# Patient Record
Sex: Male | Born: 1935 | Race: Asian | Hispanic: No | Marital: Single | Smoking: Former smoker
Health system: Southern US, Community
[De-identification: ages and names within clinical notes are randomized; demographics above are authoritative.]

## PROBLEM LIST (undated history)

## (undated) DIAGNOSIS — H5702 Anisocoria: Secondary | ICD-10-CM

## (undated) DIAGNOSIS — M1990 Unspecified osteoarthritis, unspecified site: Secondary | ICD-10-CM

## (undated) DIAGNOSIS — Z993 Dependence on wheelchair: Secondary | ICD-10-CM

## (undated) DIAGNOSIS — H269 Unspecified cataract: Secondary | ICD-10-CM

## (undated) DIAGNOSIS — J449 Chronic obstructive pulmonary disease, unspecified: Secondary | ICD-10-CM

## (undated) HISTORY — DX: Dependence on wheelchair: Z99.3

## (undated) HISTORY — DX: Anisocoria: H57.02

## (undated) HISTORY — DX: Chronic obstructive pulmonary disease, unspecified: J44.9

---

## 2014-08-07 ENCOUNTER — Encounter (HOSPITAL_COMMUNITY): Payer: Self-pay

## 2014-08-07 ENCOUNTER — Emergency Department (INDEPENDENT_AMBULATORY_CARE_PROVIDER_SITE_OTHER)
Admission: EM | Admit: 2014-08-07 | Discharge: 2014-08-07 | Disposition: A | Discharge status: Home / Self Care | Payer: Medicaid Other | Source: Home / Self Care | Attending: Family Medicine | Admitting: Family Medicine

## 2014-08-07 DIAGNOSIS — R9431 Abnormal electrocardiogram [ECG] [EKG]: Secondary | ICD-10-CM

## 2014-08-07 DIAGNOSIS — Z993 Dependence on wheelchair: Secondary | ICD-10-CM | POA: Diagnosis not present

## 2014-08-07 HISTORY — DX: Unspecified osteoarthritis, unspecified site: M19.90

## 2014-08-07 HISTORY — DX: Unspecified cataract: H26.9

## 2014-08-07 LAB — POCT I-STAT, CHEM 8
BUN: 9 mg/dL (ref 6–20)
CREATININE: 0.9 mg/dL (ref 0.61–1.24)
Calcium, Ion: 1.21 mmol/L (ref 1.13–1.30)
Chloride: 101 mmol/L (ref 101–111)
Glucose, Bld: 99 mg/dL (ref 65–99)
HCT: 41 % (ref 39.0–52.0)
Hemoglobin: 13.9 g/dL (ref 13.0–17.0)
POTASSIUM: 4.2 mmol/L (ref 3.5–5.1)
Sodium: 140 mmol/L (ref 135–145)
TCO2: 26 mmol/L (ref 0–100)

## 2014-08-07 NOTE — ED Provider Notes (Signed)
Lee Irwin is a 81 y.o. male who presents to Urgent Care today for establish care. Patient is a very elderly gentleman from [place]. He recently immigrated a few days ago. He presents to urgent care today and attempt to establish care. He has several medical conditions most prominently mobility impairment due to a CVA about 7 years ago. His son state that he has trouble making his feet work and walking. He has been confined to wheelchair for the last several years. He has not had any worsening symptoms recently. Additionally he has some unknown irregular heartbeat that was diagnosed in [place] for which he only takes aspirin. He denies any chest pains currently. Additionally he notes poor vision however this is been ongoing for some time now with no acute changes. His son say that he eats and drinks well and seems to be in good health. Of note there concerned about tuberculosis. As part of his immigration to have states he had a workup including a chest x-ray that was concerning for tuberculosis. He's had 3 negative sputum tests dated June 7, eighth, ninth of 2016. This information will be scanned into the computer system.   Past Medical History  Diagnosis Date  . Stroke   . Arthritis   . Cataract    History reviewed. No pertinent past surgical history. History  Substance Use Topics  . Smoking status: Unknown If Ever Smoked  . Smokeless tobacco: Not on file  . Alcohol Use: No   ROS as above Medications: No current facility-administered medications for this encounter.   Current Outpatient Prescriptions  Medication Sig Dispense Refill  . Aspirin (ECOTRIN PO) Take 75 mg by mouth.     No Known Allergies   Exam:  BP 120/51 mmHg  Pulse 80  Temp(Src) 98.2 F (36.8 C) (Oral)  Resp 20  SpO2 97% Gen: Thin ill-appearing elderly man HEENT: EOMI,  MMM Lungs: Normal work of breathing. CTABL Heart: RRR no MRG Abd: NABS, Soft. Nondistended, Nontender Exts: Brisk capillary refill, warm  and well perfused. Flexion contractures of the ankles and knees present.  ED ECG REPORT   Date: 08/07/2014  Rate: 77  Rhythm: normal sinus rhythm  QRS Axis: normal  Intervals: QT prolonged and QTc 502  ST/T Wave abnormalities: T wave inversion V2 and V3  Conduction Disutrbances:nonspecific intraventricular conduction delay  Narrative Interpretation:   Old EKG Reviewed: none available   No arrhythmia on 1 minute rhythm strip.  I have personally reviewed the EKG tracing and agree with the computerized printout as noted.   Results for orders placed or performed during the hospital encounter of 08/07/14 (from the past 24 hour(s))  I-STAT, chem 8     Status: None   Collection Time: 08/07/14  3:23 PM  Result Value Ref Range   Sodium 140 135 - 145 mmol/L   Potassium 4.2 3.5 - 5.1 mmol/L   Chloride 101 101 - 111 mmol/L   BUN 9 6 - 20 mg/dL   Creatinine, Ser 1.610.90 0.61 - 1.24 mg/dL   Glucose, Bld 99 65 - 99 mg/dL   Calcium, Ion 0.961.21 0.451.13 - 1.30 mmol/L   TCO2 26 0 - 100 mmol/L   Hemoglobin 13.9 13.0 - 17.0 g/dL   HCT 40.941.0 81.139.0 - 91.452.0 %   No results found.  Assessment and Plan: 81 y.o. male with multiple chronic medical conditions mostly related to his age and his history of CVA. His most important medical problem now is his wheelchair bound status.  I don't think are going to be able to fix any of his chronic problems at today's visit. Ultimately he requires a primary care provider and I have given information to refer to the [facility] wellness [facility]. He should continue taking aspirin and follow-up with a PCP. No hypertension or arrhythmia on today's rhythm strip. I think patient does not have tuberculosis based on his sputum studies. He may need a follow-up with an ophthalmologist at some point in the future for cataract surgery  [place] phone interpreter used for today's visit.  Discussed warning signs or symptoms. Please see discharge instructions. Patient expresses  understanding.     Rodolph Bong, MD 08/07/14 [phone]

## 2014-08-07 NOTE — Discharge Instructions (Signed)
Thank you for coming in today. Follow up with [facility] [address] [place], [place] [address] [phone]  Please call or see Ms Antionette CharMaggy Mena for assistance with your bill.  You may qualify for reduced or free services.  Her phone number is [phone]. Her email is [email or web address]  Call or go to the emergency room if you get worse, have trouble breathing, have chest pains, or palpitations.

## 2014-08-07 NOTE — ED Notes (Addendum)
Here as recent immigrant/ refugee from [place] via [place]. Case worker was unable to get a PCP for him to do a health assessment today. Concern for poss. TB, arrhythmia of heart.  Has papers from [facility] lab reports of 3 sequential days of negative sputum smears done earlier this month. Not on any current TB therapy

## 2014-08-08 NOTE — ED Notes (Signed)
Accessed record for case manager, looking for papers left in department.  Located envelope with patient's name and agreed to leave at the front office for grandson to pick up.  Adam at front desk notified of plan

## 2014-08-10 ENCOUNTER — Encounter: Payer: Self-pay | Admitting: Family Medicine

## 2014-08-10 ENCOUNTER — Other Ambulatory Visit (HOSPITAL_COMMUNITY)
Admission: RE | Admit: 2014-08-10 | Discharge: 2014-08-10 | Disposition: A | Discharge status: Home / Self Care | Payer: Medicaid Other | Source: Ambulatory Visit | Attending: Family Medicine | Admitting: Family Medicine

## 2014-08-10 ENCOUNTER — Ambulatory Visit (INDEPENDENT_AMBULATORY_CARE_PROVIDER_SITE_OTHER): Payer: Medicaid Other | Admitting: Family Medicine

## 2014-08-10 ENCOUNTER — Ambulatory Visit: Payer: Self-pay | Admitting: Family Medicine

## 2014-08-10 VITALS — BP 130/55 | HR 81 | Temp 97.7°F

## 2014-08-10 DIAGNOSIS — A150 Tuberculosis of lung: Secondary | ICD-10-CM | POA: Diagnosis not present

## 2014-08-10 DIAGNOSIS — M24561 Contracture, right knee: Secondary | ICD-10-CM | POA: Diagnosis not present

## 2014-08-10 DIAGNOSIS — Z008 Encounter for other general examination: Secondary | ICD-10-CM

## 2014-08-10 DIAGNOSIS — M6250 Muscle wasting and atrophy, not elsewhere classified, unspecified site: Secondary | ICD-10-CM

## 2014-08-10 DIAGNOSIS — Z603 Acculturation difficulty: Secondary | ICD-10-CM

## 2014-08-10 DIAGNOSIS — Z993 Dependence on wheelchair: Secondary | ICD-10-CM

## 2014-08-10 DIAGNOSIS — M24562 Contracture, left knee: Secondary | ICD-10-CM

## 2014-08-10 DIAGNOSIS — J449 Chronic obstructive pulmonary disease, unspecified: Secondary | ICD-10-CM | POA: Insufficient documentation

## 2014-08-10 DIAGNOSIS — Z113 Encounter for screening for infections with a predominantly sexual mode of transmission: Secondary | ICD-10-CM | POA: Insufficient documentation

## 2014-08-10 DIAGNOSIS — H5702 Anisocoria: Secondary | ICD-10-CM | POA: Diagnosis not present

## 2014-08-10 DIAGNOSIS — D509 Iron deficiency anemia, unspecified: Secondary | ICD-10-CM | POA: Diagnosis not present

## 2014-08-10 DIAGNOSIS — A159 Respiratory tuberculosis unspecified: Secondary | ICD-10-CM | POA: Insufficient documentation

## 2014-08-10 DIAGNOSIS — Z0289 Encounter for other administrative examinations: Secondary | ICD-10-CM

## 2014-08-10 DIAGNOSIS — J439 Emphysema, unspecified: Secondary | ICD-10-CM

## 2014-08-10 LAB — COMPREHENSIVE METABOLIC PANEL
ALBUMIN: 3.2 g/dL — AB (ref 3.5–5.2)
ALT: 8 U/L (ref 0–53)
AST: 14 U/L (ref 0–37)
Alkaline Phosphatase: 75 U/L (ref 39–117)
BILIRUBIN TOTAL: 0.5 mg/dL (ref 0.2–1.2)
BUN: 11 mg/dL (ref 6–23)
CALCIUM: 8.5 mg/dL (ref 8.4–10.5)
CO2: 26 meq/L (ref 19–32)
CREATININE: 0.91 mg/dL (ref 0.50–1.35)
Chloride: 101 mEq/L (ref 96–112)
Glucose, Bld: 105 mg/dL — ABNORMAL HIGH (ref 70–99)
POTASSIUM: 3.7 meq/L (ref 3.5–5.3)
SODIUM: 137 meq/L (ref 135–145)
Total Protein: 8.3 g/dL (ref 6.0–8.3)

## 2014-08-10 LAB — LIPID PANEL
CHOL/HDL RATIO: 3.4 ratio
Cholesterol: 115 mg/dL (ref 0–200)
HDL: 34 mg/dL — AB (ref >=40)
LDL CALC: 64 mg/dL (ref 0–99)
Triglycerides: 86 mg/dL (ref <150)
VLDL: 17 mg/dL (ref 0–40)

## 2014-08-10 LAB — CBC WITH DIFFERENTIAL/PLATELET
BASOS ABS: 0 10*3/uL (ref 0.0–0.1)
Basophils Relative: 0 % (ref 0–1)
Eosinophils Absolute: 0.1 10*3/uL (ref 0.0–0.7)
Eosinophils Relative: 2 % (ref 0–5)
HEMATOCRIT: 39.7 % (ref 39.0–52.0)
Hemoglobin: 12.9 g/dL — ABNORMAL LOW (ref 13.0–17.0)
LYMPHS PCT: 35 % (ref 12–46)
Lymphs Abs: 2.2 10*3/uL (ref 0.7–4.0)
MCH: 29.3 pg (ref 26.0–34.0)
MCHC: 32.5 g/dL (ref 30.0–36.0)
MCV: 90.2 fL (ref 78.0–100.0)
MONO ABS: 0.3 10*3/uL (ref 0.1–1.0)
MPV: 8.9 fL (ref 8.6–12.4)
Monocytes Relative: 4 % (ref 3–12)
Neutro Abs: 3.7 10*3/uL (ref 1.7–7.7)
Neutrophils Relative %: 59 % (ref 43–77)
Platelets: 224 10*3/uL (ref 150–400)
RBC: 4.4 MIL/uL (ref 4.22–5.81)
RDW: 15.2 % (ref 11.5–15.5)
WBC: 6.3 10*3/uL (ref 4.0–10.5)

## 2014-08-10 LAB — RPR

## 2014-08-10 LAB — TSH: TSH: 2.216 u[IU]/mL (ref 0.350–4.500)

## 2014-08-10 MED ORDER — TIOTROPIUM BROMIDE MONOHYDRATE 18 MCG IN CAPS: 18.0000 ug | ORAL_CAPSULE | Freq: Every day | RESPIRATORY_TRACT | Status: DC

## 2014-08-10 MED ORDER — OMEPRAZOLE 20 MG PO CPDR: 20.0000 mg | DELAYED_RELEASE_CAPSULE | Freq: Every day | ORAL | Status: DC

## 2014-08-10 MED ORDER — FLUTICASONE-SALMETEROL 100-50 MCG/DOSE IN AEPB: 1.0000 | INHALATION_SPRAY | Freq: Two times a day (BID) | RESPIRATORY_TRACT | Status: DC

## 2014-08-10 NOTE — Progress Notes (Signed)
Subjective:   Language Resources:  Lee Irwin interpreter  Lee Irwin is a 81 y.o. male who presents to [facility] today to establish care:  1.  Wheelchair bound:  States secondary to "Paralysis" in Right leg.  Has had gradually weakening since then.  Was weaker on Right side first and then bilaterally, but with good strength now.  Does have chronic muscle wasting and knee contractures.    2.  Underweight:  Eats three times a day but small amount.  Diarrhea if tries to eat more than a handful of food.  Eats rice slurry to make it soft (porridge), sometimes milk, lentil soup.  Also has tea, cookies, and bread.    Several chronic medical issues: COPD, A fib, EF < 40%, BL knee contractures secondary to stroke, anemia (Hgb of 9.6 based on labs obtained 06/15/2010), decreased vision in both eyes. Hx/o treated TB.  He was previously taking Clopidogrol 75 mg daily (unclear indication), albuterol 2 puffs as needed, and the overseas equivalent of Advair (Seroflow).    Current medications:   Spiriva [place] equivalent of Advair Plavix (though he doesn't have this with him) Omeprazole 20 Theophylline 100 mcg ASA 75 mg  No other bronchodilators.    Arrived in [place] on July 31, 2014.   Wheelchair bound for 4-5 years.  From [place].  Lived in refugee camp in [place] for 22 years.  Originally from [place].  Patient and family state that his memory is very good.  Son and Son in law present along with Far Ri from [facility].   Lives with son, daughter in law, granddaughter, and grandson-in-law.    ROS as above per HPI, otherwise neg.  Pertinently, no chest pain, palpitations, SOB, Fever, Chills, Abd pain, N/V/D.   The following portions of the patient's history were reviewed and updated as appropriate: allergies, current medications, past medical history, family and social history, and problem list.    PMH reviewed.  Past Medical History  Diagnosis Date  . Stroke   . Arthritis   . Cataract     No past surgical history on file.  Medications reviewed. Current Outpatient Prescriptions  Medication Sig Dispense Refill  . Aspirin (ECOTRIN PO) Take 75 mg by mouth.     No current facility-administered medications for this visit.     Objective:   Physical Exam BP 130/55 mmHg  Pulse 81  Temp(Src) 97.7 F (36.5 C) (Oral) Gen:  Alert, cooperative patient who appears stated age in no acute distress.  Vital signs reviewed.  Very thin male.  Seated in wheelchair.  Cachexia noted.   HEENT: EOMI.  Right eye with some mild reaction to light.  Notable cataract.  Left eye does NOT react.  However states he can see well.  MMM.  Marked temporal wasting.   Neck:  Supple.   Cardiac:  Regular rate and rhythm  Pulm:  Clear to auscultation bilaterally with good air movement.  No wheezes or rales noted.   Abd: Thin.  No masses.  Nontender.   Exts: Non edematous BL  LE, warm and well perfused.  MSK:  Unable to extend legs beyond about 60 degrees.  Able to fully move feet and ankles.  Also BL UE's moves well.   Skin:  No rash or skin breakdown noted.   Neuro:  Alert and oriented x 3.  Strength 5/5 BL UE's and BL LE's.  Sensation intact throughout.  Psych:  Not depressed or anxious appearing.  Linear and coherent thought process as  evidenced by speech pattern. Smiles spontaneously.   Spent an hour in patient care, including 45 minutes of face time with patient.

## 2014-08-10 NOTE — Assessment & Plan Note (Signed)
Not entirely clear why.  Seems mostly to be deconditioning and lack of muscle mass. Family states they were told "paralysis" but can fully move all arms and legs with good strength.   Awaiting for labwork to return.

## 2014-08-10 NOTE — Patient Instructions (Signed)
Come back for a short visit on Friday July 22.  We will review your medicines.    Come back around 9 AM.  It is OK to double-book.

## 2014-08-10 NOTE — Assessment & Plan Note (Signed)
Based on IOM paperwork.   Without over blood loss.  Also cachectic.   Repeating blood work today.  Concern of course would be for cancer.   May need stool cards versus colonoscopy.

## 2014-08-10 NOTE — Assessment & Plan Note (Signed)
Unclear etiology. Doesn't appear to have had a stroke, unless he has had very good recovery.  Also knee contractures are bilateral and he is without weakness.

## 2014-08-10 NOTE — Assessment & Plan Note (Signed)
[place]reated overseas.  Negative sputums in 2012 based on IOM paperwork. Has decreased BS Left side.   Per case worker who is present today hasn't yet had HD screening appt..  May need CXR before then depending on when it's scheduled for.

## 2014-08-10 NOTE — Assessment & Plan Note (Signed)
Previously on both Advair and spiriva.  Has both of these today.  Also treated with theophylline.   As the visit took so long today, did not have time to discuss changes to his medication regimen.  Plan is to return in next 2-3 weeks for simple discussion on medication usage and refill.  Will stop theophylline at that visit.   Plan refill other inhalers.  Sounds like he doesn't and hasn't needed bronchodilator in some time.

## 2014-08-11 LAB — HIV ANTIBODY (ROUTINE TESTING W REFLEX): HIV 1&2 Ab, 4th Generation: NONREACTIVE

## 2014-08-11 LAB — SICKLE CELL SCREEN: SICKLE CELL SCREEN: NEGATIVE

## 2014-08-11 LAB — HEPATITIS B SURFACE ANTIGEN: Hepatitis B Surface Ag: NEGATIVE

## 2014-08-12 LAB — QUANTIFERON TB GOLD ASSAY (BLOOD)
Interferon Gamma Release Assay: NEGATIVE
MITOGEN VALUE: 6.25 [IU]/mL
Quantiferon Nil Value: 0.09 IU/mL
Quantiferon Tb Ag Minus Nil Value: 0.12 IU/mL
TB Ag value: 0.21 IU/mL

## 2014-08-14 LAB — URINE CYTOLOGY ANCILLARY ONLY
CHLAMYDIA, DNA PROBE: NEGATIVE
NEISSERIA GONORRHEA: NEGATIVE

## 2014-08-14 LAB — VARICELLA ZOSTER ANTIBODY, IGG: Varicella IgG: 848.6 Index — ABNORMAL HIGH (ref <135.00)

## 2014-08-31 ENCOUNTER — Telehealth: Payer: Self-pay | Admitting: Family Medicine

## 2014-08-31 ENCOUNTER — Ambulatory Visit (INDEPENDENT_AMBULATORY_CARE_PROVIDER_SITE_OTHER): Payer: Medicaid Other | Admitting: Family Medicine

## 2014-08-31 ENCOUNTER — Encounter: Payer: Self-pay | Admitting: Family Medicine

## 2014-08-31 ENCOUNTER — Ambulatory Visit (HOSPITAL_COMMUNITY)
Admission: RE | Admit: 2014-08-31 | Discharge: 2014-08-31 | Disposition: A | Discharge status: Home / Self Care | Payer: Medicaid Other | Source: Ambulatory Visit | Attending: Family Medicine | Admitting: Family Medicine

## 2014-08-31 VITALS — BP 130/66 | HR 80 | Temp 97.5°F

## 2014-08-31 DIAGNOSIS — R05 Cough: Secondary | ICD-10-CM | POA: Diagnosis present

## 2014-08-31 DIAGNOSIS — J439 Emphysema, unspecified: Secondary | ICD-10-CM | POA: Diagnosis not present

## 2014-08-31 DIAGNOSIS — R634 Abnormal weight loss: Secondary | ICD-10-CM

## 2014-08-31 DIAGNOSIS — K219 Gastro-esophageal reflux disease without esophagitis: Secondary | ICD-10-CM | POA: Insufficient documentation

## 2014-08-31 DIAGNOSIS — A159 Respiratory tuberculosis unspecified: Secondary | ICD-10-CM

## 2014-08-31 DIAGNOSIS — R059 Cough, unspecified: Secondary | ICD-10-CM

## 2014-08-31 DIAGNOSIS — H269 Unspecified cataract: Secondary | ICD-10-CM | POA: Insufficient documentation

## 2014-08-31 DIAGNOSIS — Z603 Acculturation difficulty: Secondary | ICD-10-CM | POA: Diagnosis not present

## 2014-08-31 DIAGNOSIS — R918 Other nonspecific abnormal finding of lung field: Secondary | ICD-10-CM | POA: Diagnosis not present

## 2014-08-31 DIAGNOSIS — I7090 Unspecified atherosclerosis: Secondary | ICD-10-CM | POA: Diagnosis not present

## 2014-08-31 DIAGNOSIS — A150 Tuberculosis of lung: Secondary | ICD-10-CM

## 2014-08-31 MED ORDER — LEVOFLOXACIN 500 MG PO TABS: 500.0000 mg | ORAL_TABLET | Freq: Every day | ORAL | Status: DC

## 2014-08-31 MED ORDER — OMEPRAZOLE 20 MG PO CPDR: 20.0000 mg | DELAYED_RELEASE_CAPSULE | Freq: Every day | ORAL | Status: DC

## 2014-08-31 NOTE — Assessment & Plan Note (Signed)
He brings all of his medications.   Still has about 1-2 months worth of Spiriva and Advair. Continue this and will send in new scripts when he is out.  Do not want to confuse family with double dosages right now.   Discussed stopping theophylline.  FU with me if issues.  Evidently he hasn't taken this for at least 2 days, if not longer.  Has had no W/D symptoms or concerns since stopping.

## 2014-08-31 NOTE — Telephone Encounter (Signed)
Called and discussed CXR results with granddaughter, who was present at visit today and who also speaks [place].  Concern for CAP.  I have reviewed the images.  Will treat with Levaquin as he has bad emphysema.    My main concern -- which I did not share with the granddaughter -- is that this is not infectious but rather a malignancy.  Will treat as infectious with abx and then get a FU CXR.  Will have discussion with family about potentiality of carcinoma next visit.

## 2014-08-31 NOTE — Assessment & Plan Note (Signed)
With fever.   Quant gold is negative.   Sending for CXR today.  Of note, he has known COPD.  Could be unrelated with fevers, but CXR to check.

## 2014-08-31 NOTE — Assessment & Plan Note (Signed)
Ongoing for "years." Inconsistent history with dysphagia. I would say he has red flag symptoms, with marked muscle wasting and abdominal pain.  Treat with PPI for now.   Will schedule for abdominal US.

## 2014-08-31 NOTE — Assessment & Plan Note (Signed)
Quant gold negative last visit.

## 2014-08-31 NOTE — Progress Notes (Addendum)
Subjective:    Lee Irwin is a 81 y.o. male who presents to [facility] today for FU for medicines:  1.  Cough and fever: Present currently.  Had some fever without cough before coming to [place], months in total.  Daughter describes with rigors as well.  Hasn't used chronic inhalers for 2 days since fever started.  Took Ibuprofen with relief.  No chest pain.  Otherwise no complaints.    2. Heartburn:  Did not pick up Omeprazole last visit, no clear reason why.  Still with burning in abdomen after eating certain meals.  No dysphagia.  No N/V.  No melena.  Nothing relieves currently.   ROS as above per HPI, otherwise neg.   The following portions of the patient's history were reviewed and updated as appropriate: allergies, current medications, past medical history, family and social history, and problem list. Patient is a nonsmoker.    PMH reviewed.  Past Medical History  Diagnosis Date  . Wheelchair bound   . Arthritis   . Cataract   . Anisocoria   . COPD (chronic obstructive pulmonary disease)    No past surgical history on file.  Medications reviewed. Current Outpatient Prescriptions  Medication Sig Dispense Refill  . Aspirin (ECOTRIN PO) Take 75 mg by mouth.    . Fluticasone-Salmeterol (ADVAIR) 100-50 MCG/DOSE AEPB Inhale 1 puff into the lungs 2 (two) times daily. 1 each 3  . omeprazole (PRILOSEC) 20 MG capsule Take 1 capsule (20 mg total) by mouth daily. 30 capsule 3  . tiotropium (SPIRIVA HANDIHALER) 18 MCG inhalation capsule Place 1 capsule (18 mcg total) into inhaler and inhale daily. 30 capsule 12   No current facility-administered medications for this visit.     Objective:   Physical Exam BP 130/66 mmHg  Pulse 80  Temp(Src) 97.5 F (36.4 C) (Oral) Gen:  Alert, cooperative patient who appears stated age in no acute distress.  Vital signs reviewed.  Cachectic.   HEENT: EOMI.  Cataract in Right eye.  Left eye with fixed, nonreactive pupil and post-surgical changes.  MMM.   Few broken teeth lower mandible.  No teeht upper mandible.   Cardiac:  Regular rate and rhythm  Pulm:  Clear to auscultation bilaterally  Abd:  Soft/nondistended/nontender.  Thin. Exts: Thin, no edema.     No results found for this or any previous visit (from the past 72 hour(s)).  Spent 30 minutes face time with patient. Other people present were teh patient's granddaughter and Far Ri the case worker from [employer].

## 2014-08-31 NOTE — Assessment & Plan Note (Signed)
Patient desires potential removal to help with vision. Will refer to ophthalmology for more full evaluation and recommendations.   As noted, Left eye with post-surgical changes and anisocoria.

## 2014-08-31 NOTE — Patient Instructions (Signed)
Go by the hospital for a chest xray.  You can go when you want.    Ask for the radiology department at the front desk.    I will call you with the results [phone].  Grand-daughter.   Take the Omeprazole one pill a day to help with the abdominal pain.   STOP the theophylline.    Come back to see me in 1 month to make sure you're doing okay.

## 2014-08-31 NOTE — Assessment & Plan Note (Signed)
Lee Irwin with [employer] provided interpretation today.

## 2014-09-03 ENCOUNTER — Telehealth: Payer: Self-pay | Admitting: Family Medicine

## 2014-09-03 NOTE — Telephone Encounter (Signed)
Called granddaughter of patient and LMOVM. Please advise her that patient has an ultrasound of his abdomen scheduled for Fri 09/14/14 at 11:30am at [facility] ( arrival 11:15am) Patient cannot have anything to eat or drink 6 hours prior to this appt.

## 2014-09-04 NOTE — Telephone Encounter (Signed)
Patient's son has been informed of appts,

## 2014-09-14 ENCOUNTER — Ambulatory Visit (HOSPITAL_COMMUNITY)
Admission: RE | Admit: 2014-09-14 | Discharge: 2014-09-14 | Disposition: A | Discharge status: Home / Self Care | Payer: Medicaid Other | Source: Ambulatory Visit | Attending: Family Medicine | Admitting: Family Medicine

## 2014-09-14 DIAGNOSIS — K219 Gastro-esophageal reflux disease without esophagitis: Secondary | ICD-10-CM

## 2014-09-14 DIAGNOSIS — N261 Atrophy of kidney (terminal): Secondary | ICD-10-CM | POA: Insufficient documentation

## 2014-09-14 DIAGNOSIS — R634 Abnormal weight loss: Secondary | ICD-10-CM

## 2014-09-20 ENCOUNTER — Encounter: Payer: Self-pay | Admitting: Family Medicine

## 2014-09-26 ENCOUNTER — Other Ambulatory Visit: Payer: Self-pay | Admitting: Infectious Disease

## 2014-09-26 ENCOUNTER — Ambulatory Visit
Admission: RE | Admit: 2014-09-26 | Discharge: 2014-09-26 | Disposition: A | Discharge status: Home / Self Care | Payer: No Typology Code available for payment source | Source: Ambulatory Visit | Attending: Infectious Disease | Admitting: Infectious Disease

## 2014-09-26 DIAGNOSIS — A150 Tuberculosis of lung: Secondary | ICD-10-CM

## 2014-09-28 ENCOUNTER — Ambulatory Visit (INDEPENDENT_AMBULATORY_CARE_PROVIDER_SITE_OTHER): Payer: Medicaid Other | Admitting: Family Medicine

## 2014-09-28 ENCOUNTER — Encounter: Payer: Self-pay | Admitting: Family Medicine

## 2014-09-28 VITALS — BP 139/61 | HR 85 | Temp 97.9°F

## 2014-09-28 DIAGNOSIS — J439 Emphysema, unspecified: Secondary | ICD-10-CM

## 2014-09-28 DIAGNOSIS — R399 Unspecified symptoms and signs involving the genitourinary system: Secondary | ICD-10-CM

## 2014-09-28 DIAGNOSIS — R202 Paresthesia of skin: Secondary | ICD-10-CM | POA: Diagnosis not present

## 2014-09-28 DIAGNOSIS — Z993 Dependence on wheelchair: Secondary | ICD-10-CM

## 2014-09-28 DIAGNOSIS — R300 Dysuria: Secondary | ICD-10-CM | POA: Diagnosis not present

## 2014-09-28 LAB — POCT UA - MICROSCOPIC ONLY

## 2014-09-28 LAB — POCT URINALYSIS DIPSTICK
BILIRUBIN UA: NEGATIVE
Glucose, UA: NEGATIVE
Ketones, UA: NEGATIVE
LEUKOCYTES UA: NEGATIVE
NITRITE UA: NEGATIVE
PROTEIN UA: 30
Spec Grav, UA: 1.015
Urobilinogen, UA: 0.2
pH, UA: 7

## 2014-09-28 MED ORDER — CEPHALEXIN 500 MG PO CAPS: 500.0000 mg | ORAL_CAPSULE | Freq: Three times a day (TID) | ORAL | Status: DC

## 2014-09-28 NOTE — Assessment & Plan Note (Signed)
With scarring on CXR.  Likely contributing to his chronic cachexia.   At baseline today.

## 2014-09-28 NOTE — Progress Notes (Signed)
Subjective:   * Nepali interpreter today Indra Chiheti. Lee Irwin is a 81 y.o. male who presents to [facility] today for 2 issues:  1.  Paresthesias in legs:  Present for several months, since before coming to [place].  Describes burning in bilateral legs.  3/10 in nature.  Doesn't awaken him from sleep.  Not worsening.    2.  LUTS:  Has had dysuria and hesitancy for several months, also before coming to the [place].  Difficulty with urinating.  Prolonged hesitancy, then stream starts with dysuria, then hesitancy again.  Incomplete sensation of voiding.  No fevers or chills.  No constipation.  3.  FU for CXR:  CXR last visit for cough and repeat from prior CXR.  Most recent showed scarring consistent with emphysema and fibrosis.  NO further cough.  No dyspnea at baseline.  As above, no fever or headaches.    4.  Question about home aide. Family would like home aide for patient.  He is wheelchair bound following stroke like symptoms.  Also with pulmonary fibrosis.  Children work all day.  Hard to manage for himself as grandchildren will be going back to school.     The following portions of the patient's history were reviewed and updated as appropriate: allergies, current medications, past medical history, family and social history, and problem list. Patient is a nonsmoker.    PMH reviewed.  Past Medical History  Diagnosis Date  . Wheelchair bound   . Arthritis   . Cataract   . Anisocoria   . COPD (chronic obstructive pulmonary disease)    No past surgical history on file.  Medications reviewed. Current Outpatient Prescriptions  Medication Sig Dispense Refill  . Aspirin (ECOTRIN PO) Take 75 mg by mouth.    . Fluticasone-Salmeterol (ADVAIR) 100-50 MCG/DOSE AEPB Inhale 1 puff into the lungs 2 (two) times daily. 1 each 3  . levofloxacin (LEVAQUIN) 500 MG tablet Take 1 tablet (500 mg total) by mouth daily. 7 tablet 0  . omeprazole (PRILOSEC) 20 MG capsule Take 1 capsule (20 mg total) by mouth  daily. 30 capsule 3  . tiotropium (SPIRIVA HANDIHALER) 18 MCG inhalation capsule Place 1 capsule (18 mcg total) into inhaler and inhale daily. 30 capsule 12   No current facility-administered medications for this visit.     Objective:   Physical Exam BP 139/61 mmHg  Pulse 85  Temp(Src) 97.9 F (36.6 C) (Oral)  Ht   Wt  Gen:  Cachectic male in wheelchair, NAD.  Smiling and conversant.   HEENT: EOMI,  MMM Cardiac:  Regular rate and rhythm  Pulm:  Coarse BS BL, mostly at bases.   Abd: Thin/NT.  No swelling or suprapubic discomfort.   Neuro:  Sensation intact BL feet and legs to light touch.   No results found for this or any previous visit (from the past 72 hour(s)).

## 2014-09-28 NOTE — Patient Instructions (Signed)
Take the Keflex three times a day for 1 week.  This is for urine infection.  Come back for a recheck in about 1 month.  Let me know if the burning in your legs gets worse.    It was good to see you today.

## 2014-09-28 NOTE — Assessment & Plan Note (Signed)
Concern of course is for BPH or even prostatic cancer in this cachectic male.  He has hemoglobin in his urine.  Will send for culture. Treat with keflex.   FU in 1 month to repeat U/A.

## 2014-09-28 NOTE — Assessment & Plan Note (Signed)
After discussing with him, he states it's not bad enough to warrant treatment.   Will keep me informed if worsens.

## 2014-09-28 NOTE — Assessment & Plan Note (Signed)
Will complete home aide paperwork.

## 2014-09-29 LAB — URINE CULTURE: Colony Count: 75000

## 2014-10-30 ENCOUNTER — Ambulatory Visit (INDEPENDENT_AMBULATORY_CARE_PROVIDER_SITE_OTHER): Payer: Medicaid Other | Admitting: Family Medicine

## 2014-10-30 ENCOUNTER — Encounter: Payer: Self-pay | Admitting: Family Medicine

## 2014-10-30 VITALS — BP 149/64 | HR 75 | Temp 97.3°F

## 2014-10-30 DIAGNOSIS — R918 Other nonspecific abnormal finding of lung field: Secondary | ICD-10-CM | POA: Diagnosis not present

## 2014-10-30 DIAGNOSIS — Z8673 Personal history of transient ischemic attack (TIA), and cerebral infarction without residual deficits: Secondary | ICD-10-CM

## 2014-10-30 DIAGNOSIS — Z993 Dependence on wheelchair: Secondary | ICD-10-CM

## 2014-10-30 DIAGNOSIS — R399 Unspecified symptoms and signs involving the genitourinary system: Secondary | ICD-10-CM | POA: Diagnosis not present

## 2014-10-30 DIAGNOSIS — M24561 Contracture, right knee: Secondary | ICD-10-CM | POA: Diagnosis not present

## 2014-10-30 DIAGNOSIS — M25561 Pain in right knee: Secondary | ICD-10-CM

## 2014-10-30 DIAGNOSIS — M24562 Contracture, left knee: Secondary | ICD-10-CM

## 2014-10-30 DIAGNOSIS — R911 Solitary pulmonary nodule: Secondary | ICD-10-CM | POA: Diagnosis not present

## 2014-10-30 DIAGNOSIS — M25562 Pain in left knee: Secondary | ICD-10-CM

## 2014-10-30 NOTE — Progress Notes (Addendum)
Subjective:    Lee Irwin is a 81 y.o. male who presents to [facility] today to discuss having a home health aide and for FU from last visit:  1.  Home health aide:  States there are people at home (family members), but there are often times when everyone has to work and patient has to be home by himself.  Cannot stand or walk around apartment.  Grandson present today reports "he just sits there" if there is no one to care for him.  Diagnosed with stroke while in [place]. Wheelchair or bedbound.   2.  LUTS in male:  Present last visit.  U/A with blood in urine.  Symptoms have completely resolved on keflex.    ROS as above per HPI, otherwise neg.    The following portions of the patient's history were reviewed and updated as appropriate: allergies, current medications, past medical history, family and social history, and problem list. Patient is a nonsmoker.    PMH reviewed.  Past Medical History  Diagnosis Date  . Wheelchair bound   . Arthritis   . Cataract   . Anisocoria   . COPD (chronic obstructive pulmonary disease)    No past surgical history on file.  Medications reviewed. Current Outpatient Prescriptions  Medication Sig Dispense Refill  . Aspirin (ECOTRIN PO) Take 75 mg by mouth.    . cephALEXin (KEFLEX) 500 MG capsule Take 1 capsule (500 mg total) by mouth 3 (three) times daily. X 7 days 21 capsule 0  . Fluticasone-Salmeterol (ADVAIR) 100-50 MCG/DOSE AEPB Inhale 1 puff into the lungs 2 (two) times daily. 1 each 3  . levofloxacin (LEVAQUIN) 500 MG tablet Take 1 tablet (500 mg total) by mouth daily. 7 tablet 0  . omeprazole (PRILOSEC) 20 MG capsule Take 1 capsule (20 mg total) by mouth daily. 30 capsule 3  . tiotropium (SPIRIVA HANDIHALER) 18 MCG inhalation capsule Place 1 capsule (18 mcg total) into inhaler and inhale daily. 30 capsule 12   No current facility-administered medications for this visit.     Objective:   Physical Exam BP 149/64 mmHg  Pulse 75  Temp(Src)  97.3 F (36.3 C) (Oral) Gen:  Alert, cooperative patient who appears stated age in no acute distress.  Vital signs reviewed.  Wheelchair bound. Cachectic.  Pleasant and enthusiasticly conversant.  HEENT: EOMI,  MMM.  MMM Cardiac:  Regular rate and rhythm Pulm:  Coarse BS at bases.  Good air movement.  Abd:  Soft/nondistended/nontender.  Thin.   Neuro:  CN II-XII intact.  5/5 strength BL upper extremities.  Knee extension and flexion 4/5 BL.  Hip flexors are 2/5.  Attempted to help him stand, but he is unable to do so secondary to weakness.  MSK:  Severe muscle wasting BL legs.  Also with knee contractures, unable to extend beyond about 45 degrees.   No results found for this or any previous visit (from the past 72 hour(s)).

## 2014-10-30 NOTE — Progress Notes (Signed)
CSW received a referral to meet with pt and cousin to discuss Personal Care Services. CSW communicated via the use of an interpretor through [employer] Jesusita Oka was the name of the Interpretor.) Pts cousin, Osvaldo Shipper, who was present in the room stated that pt has family that comes and goes during the day however pt does spend a lot of time alone. [facility] is interested in an aide to provide assistance to pt while he is alone. CSW informed [facility] that CSW and PCP would complete an application however it was important that an English-speaking contact would be available to speak to the agency once they call. [facility] provided CSW with a family member, Bernarda Caffey [phone] who can be considered the main contact. CSW thanked pt and cousin and informed them that the family would receive a call in the next few weeks.  No additional questions.  Theresia Bough, MSW, LCSW [phone]

## 2014-10-30 NOTE — Patient Instructions (Addendum)
We will get some x-rays of your knees.   We will also get a CT scan of your chest.  You will need an appt for this.   Come back to see me in 6 weeks or so.

## 2014-11-01 DIAGNOSIS — Z8673 Personal history of transient ischemic attack (TIA), and cerebral infarction without residual deficits: Secondary | ICD-10-CM | POA: Insufficient documentation

## 2014-11-01 DIAGNOSIS — R918 Other nonspecific abnormal finding of lung field: Secondary | ICD-10-CM | POA: Insufficient documentation

## 2014-11-01 NOTE — Assessment & Plan Note (Addendum)
Likely secondary from neurological insult suffered while in [place].   Obtaining xrays today to further examine structure of knee, mostly at patient request as they are interested in assistive devices to help with ambulation.

## 2014-11-01 NOTE — Assessment & Plan Note (Signed)
History of these found on initial CXR.   CT screening today.  Would provide explanation for degree of his cachexia.

## 2014-11-01 NOTE — Assessment & Plan Note (Addendum)
Neurological insult while in [place], sudden loss of lower extremity strength, with gradual contractures since then.   Bed/wheelchair bound secondary to this.  I will complete home health aide paperwork to help with assistance at home.

## 2014-11-01 NOTE — Assessment & Plan Note (Signed)
Resolved s/p keflex.   They declined repeat U/A today as they had so much trouble getting a U/A last visit.  Plan to repeat in next visit or so.  Patient seems okay with this.

## 2014-11-01 NOTE — Assessment & Plan Note (Signed)
As above.  Unable to walk due to weakness in flexor muscles.

## 2014-11-02 ENCOUNTER — Encounter: Payer: Self-pay | Admitting: Clinical

## 2014-11-02 NOTE — Progress Notes (Signed)
Referral for PCS has been faxed to [facility] (both numbers.) Agency will contact pts family to schedule a home visit to determine how many hours pt will qualify for. Theresia Bough, MSW, LCSW [phone]

## 2014-11-06 ENCOUNTER — Ambulatory Visit (HOSPITAL_COMMUNITY)
Admission: RE | Admit: 2014-11-06 | Discharge: 2014-11-06 | Disposition: A | Discharge status: Home / Self Care | Payer: Medicaid Other | Source: Ambulatory Visit | Attending: Family Medicine | Admitting: Family Medicine

## 2014-11-06 DIAGNOSIS — M85861 Other specified disorders of bone density and structure, right lower leg: Secondary | ICD-10-CM | POA: Diagnosis not present

## 2014-11-06 DIAGNOSIS — R911 Solitary pulmonary nodule: Secondary | ICD-10-CM

## 2014-11-06 DIAGNOSIS — M24562 Contracture, left knee: Secondary | ICD-10-CM

## 2014-11-06 DIAGNOSIS — M24561 Contracture, right knee: Secondary | ICD-10-CM | POA: Diagnosis not present

## 2014-11-06 DIAGNOSIS — M170 Bilateral primary osteoarthritis of knee: Secondary | ICD-10-CM | POA: Insufficient documentation

## 2014-11-06 DIAGNOSIS — M85862 Other specified disorders of bone density and structure, left lower leg: Secondary | ICD-10-CM | POA: Insufficient documentation

## 2014-11-06 DIAGNOSIS — I2510 Atherosclerotic heart disease of native coronary artery without angina pectoris: Secondary | ICD-10-CM | POA: Diagnosis not present

## 2014-11-06 DIAGNOSIS — M25562 Pain in left knee: Principal | ICD-10-CM

## 2014-11-06 DIAGNOSIS — M25561 Pain in right knee: Secondary | ICD-10-CM

## 2014-11-06 DIAGNOSIS — R918 Other nonspecific abnormal finding of lung field: Secondary | ICD-10-CM | POA: Insufficient documentation

## 2014-11-21 ENCOUNTER — Encounter: Payer: Self-pay | Admitting: Family Medicine

## 2014-12-11 ENCOUNTER — Ambulatory Visit (INDEPENDENT_AMBULATORY_CARE_PROVIDER_SITE_OTHER): Payer: Medicaid Other | Admitting: Family Medicine

## 2014-12-11 ENCOUNTER — Encounter: Payer: Self-pay | Admitting: Family Medicine

## 2014-12-11 VITALS — BP 157/63 | HR 79 | Temp 97.6°F

## 2014-12-11 DIAGNOSIS — R911 Solitary pulmonary nodule: Secondary | ICD-10-CM | POA: Diagnosis not present

## 2014-12-11 DIAGNOSIS — IMO0001 Reserved for inherently not codable concepts without codable children: Secondary | ICD-10-CM | POA: Insufficient documentation

## 2014-12-11 NOTE — Patient Instructions (Signed)
We reviewed your imaging studies today.  Overall things look ok.   Come back to see me in February, or sooner if you are sick.  We will repeat the  CT scan in April of next year

## 2014-12-11 NOTE — Progress Notes (Signed)
Subjective:    Lee Irwin is a 81 y.o. male who [facility] today for FU for radiographs:  1.  FU for radiographs: Discussion on knee radiographs and CT of chest.  Wheel-chair bound refugee.  No acute issues since last visit.  Pain and breathing are both controlled/stable.    ROS as above per HPI, otherwise neg.  Pertinently, no chest pain, palpitations, SOB, Fever, Chills, Abd pain, N/V/D.   The following portions of the patient's history were reviewed and updated as appropriate: allergies, current medications, past medical history, family and social history, and problem list. Patient is a nonsmoker.    PMH reviewed.  Past Medical History  Diagnosis Date  . Wheelchair bound   . Arthritis   . Cataract   . Anisocoria   . COPD (chronic obstructive pulmonary disease) (HCC)    No past surgical history on file.  Medications reviewed. Current Outpatient Prescriptions  Medication Sig Dispense Refill  . Aspirin (ECOTRIN PO) Take 75 mg by mouth.    . cephALEXin (KEFLEX) 500 MG capsule Take 1 capsule (500 mg total) by mouth 3 (three) times daily. X 7 days 21 capsule 0  . Fluticasone-Salmeterol (ADVAIR) 100-50 MCG/DOSE AEPB Inhale 1 puff into the lungs 2 (two) times daily. 1 each 3  . levofloxacin (LEVAQUIN) 500 MG tablet Take 1 tablet (500 mg total) by mouth daily. 7 tablet 0  . omeprazole (PRILOSEC) 20 MG capsule Take 1 capsule (20 mg total) by mouth daily. 30 capsule 3  . tiotropium (SPIRIVA HANDIHALER) 18 MCG inhalation capsule Place 1 capsule (18 mcg total) into inhaler and inhale daily. 30 capsule 12   No current facility-administered medications for this visit.     Objective:   Physical Exam BP 157/63 mmHg  Pulse 79  Temp(Src) 97.6 F (36.4 C) (Oral)  Ht   Wt  Gen:  Alert, cooperative patient who appears stated age in no acute distress.  Vital signs reviewed.  Cachectic male in wheelchair.  Cardiac:  Regular rate and rhythm without murmur auscultated.  Good  S1/S2. Pulm:  Minimal rhonchi at bases.     No results found for this or any previous visit (from the past 72 hour(s)).  1. FU for radiographs: - discussed OA but otherwise nothing to do for knees.  He is not currently in pain. - Just wanted to know if there was surgery or medicine which would be able to help him walk again.  Discussed not.   2.  Lung nodule: - repeat CT scan recommended in 6 months.   - breathing is stable.

## 2015-03-19 ENCOUNTER — Ambulatory Visit (INDEPENDENT_AMBULATORY_CARE_PROVIDER_SITE_OTHER): Payer: Medicaid Other | Admitting: Family Medicine

## 2015-03-19 VITALS — BP 109/49 | HR 73 | Temp 97.7°F

## 2015-03-19 DIAGNOSIS — R202 Paresthesia of skin: Secondary | ICD-10-CM

## 2015-03-19 DIAGNOSIS — E860 Dehydration: Secondary | ICD-10-CM | POA: Diagnosis present

## 2015-03-19 DIAGNOSIS — R42 Dizziness and giddiness: Secondary | ICD-10-CM | POA: Diagnosis not present

## 2015-03-19 LAB — BASIC METABOLIC PANEL
BUN: 20 mg/dL (ref 7–25)
CHLORIDE: 105 mmol/L (ref 98–110)
CO2: 24 mmol/L (ref 20–31)
Calcium: 9.1 mg/dL (ref 8.6–10.3)
Creat: 1.08 mg/dL (ref 0.70–1.11)
GLUCOSE: 92 mg/dL (ref 65–99)
POTASSIUM: 5.2 mmol/L (ref 3.5–5.3)
Sodium: 139 mmol/L (ref 135–146)

## 2015-03-19 NOTE — Progress Notes (Signed)
Subjective:    Lee Irwin is a 82 y.o. male who presents to [facility] today for:  1.  Dizziness:  Present for past 2 weeks or so.  Feels lightheaded even when sitting. Worse when he bends over (cannot stand secondary to residual deficits from stroke). Drinks some water, but according to grandson only drinks about 1 to 1.5 bottles of water a day.  Grandson reports he is eating well.  No difficulty with turning his head/no vertiginous symptoms.    Inability to weigh in clinic due to being wheelchair bound.    2.  Burning in feet:  Worse at night.  Present for past several days.  No trauma to area.  States he can sleep and burning doesn't keep him awake.  No polyuria/polydipsia.    ROS as above per HPI, otherwise neg.    The following portions of the patient's history were reviewed and updated as appropriate: allergies, current medications, past medical history, family and social history, and problem list. Patient is a nonsmoker.    PMH reviewed.  Past Medical History  Diagnosis Date  . Wheelchair bound   . Arthritis   . Cataract   . Anisocoria   . COPD (chronic obstructive pulmonary disease) (HCC)    No past surgical history on file.  Medications reviewed. Current Outpatient Prescriptions  Medication Sig Dispense Refill  . Aspirin (ECOTRIN PO) Take 75 mg by mouth.    . Fluticasone-Salmeterol (ADVAIR) 100-50 MCG/DOSE AEPB Inhale 1 puff into the lungs 2 (two) times daily. 1 each 3  . omeprazole (PRILOSEC) 20 MG capsule Take 1 capsule (20 mg total) by mouth daily. 30 capsule 3  . tiotropium (SPIRIVA HANDIHALER) 18 MCG inhalation capsule Place 1 capsule (18 mcg total) into inhaler and inhale daily. 30 capsule 12   No current facility-administered medications for this visit.     Objective:   Physical Exam BP 109/49 mmHg  Pulse 73  Temp(Src) 97.7 F (36.5 C) (Oral) Gen:  Alert, cooperative patient who appears stated age in no acute distress.  Vital signs reviewed.  In  wheelchair.  Awake and alert.  Conversant and pleasant.  HEENT: EOMI,  MMM Cardiac:  Regular rate and rhythm  Pulm:  Clear to auscultation bilaterally with good air movement.  No wheezes or rales noted.   WUJ:WJXB/[id number].   Exts: Non edematous BL  LE, warm and well perfused.   No results found for this or any previous visit (from the past 72 hour(s)).

## 2015-03-19 NOTE — Patient Instructions (Addendum)
Come back to see me next Tuesday.    Make sure you increase how much you are drinking at home.   We are checking labs today.    We will treat your burning next week.   If you have any shortness of breath, fevers, chest pain, or worsening symptoms, please come back immediately or go to the ER after hours.  It was good to see you today!

## 2015-03-21 DIAGNOSIS — R42 Dizziness and giddiness: Secondary | ICD-10-CM | POA: Insufficient documentation

## 2015-03-21 NOTE — Assessment & Plan Note (Signed)
Has mentioned this previously.  No evidence of diabetes.  Likely secondary to CVA.  Will address next week with gabapentin for relief once we know lightheaded symptoms have resolved.

## 2015-03-21 NOTE — Assessment & Plan Note (Signed)
Likely secondary to dehydration/decreased fluid intake.  Discussed with patient and son about increasing fluids within reasonable amount. Checking for hyponatremia today as well or any signs of anemia.   Doesn't sound like vertigo.   FU in 1 week to assess for improvement.

## 2015-03-26 ENCOUNTER — Ambulatory Visit (INDEPENDENT_AMBULATORY_CARE_PROVIDER_SITE_OTHER): Payer: Medicaid Other | Admitting: Family Medicine

## 2015-03-26 ENCOUNTER — Encounter: Payer: Self-pay | Admitting: Family Medicine

## 2015-03-26 VITALS — BP 143/67 | HR 79 | Temp 98.0°F

## 2015-03-26 DIAGNOSIS — G6289 Other specified polyneuropathies: Secondary | ICD-10-CM

## 2015-03-26 DIAGNOSIS — R202 Paresthesia of skin: Secondary | ICD-10-CM | POA: Diagnosis not present

## 2015-03-26 DIAGNOSIS — R42 Dizziness and giddiness: Secondary | ICD-10-CM

## 2015-03-26 DIAGNOSIS — J439 Emphysema, unspecified: Secondary | ICD-10-CM | POA: Diagnosis not present

## 2015-03-26 MED ORDER — GABAPENTIN 100 MG PO CAPS: 100.0000 mg | ORAL_CAPSULE | Freq: Three times a day (TID) | ORAL | Status: DC

## 2015-03-26 NOTE — Progress Notes (Signed)
Subjective:   Interpreter: Janeann Forehand [id number] used for visit:   Lee Irwin is a 82 y.o. male who presents to [facility] today for follow-up for dehydration:  1.  Dehydration:  Doing much better.  No further dizziness or lightheadedness. He has increased his fluid intake of mostly water daily.  No falls.  No diarrhea/N/V.  2.  Burning in feet:  Present at night. Describes burning sensation that is in bilateral feet and extends from feet to ankles. Has not tried anything for relief.  Present most nights.  Doesn't seem to awaken him from sleep.  No history of diabetes.  No trauma to area.    ROS as above per HPI, otherwise neg.    The following portions of the patient's history were reviewed and updated as appropriate: allergies, current medications, past medical history, family and social history, and problem list. Patient is a nonsmoker.    PMH reviewed.  Past Medical History  Diagnosis Date  . Wheelchair bound   . Arthritis   . Cataract   . Anisocoria   . COPD (chronic obstructive pulmonary disease) (HCC)    No past surgical history on file.  Medications reviewed. Current Outpatient Prescriptions  Medication Sig Dispense Refill  . Aspirin (ECOTRIN PO) Take 75 mg by mouth. Reported on 03/26/2015     No current facility-administered medications for this visit.     Objective:   Physical Exam BP 143/67 mmHg  Pulse 79  Temp(Src) 98 F (36.7 C) (Oral)  Wt  Gen:  Alert, cooperative patient who appears stated age in no acute distress.  Vital signs reviewed. Thin and combined wheelchair. HEENT: EOMI,  MMM Pulm: No crackles noted Exts: Thin lower extremities. No edema. Skin: Still some minimal skin tenting Neuro:  Sensation intact BL LE's to light and gross touch.    No results found for this or any previous visit (from the past 72 hour(s)).

## 2015-03-26 NOTE — Patient Instructions (Signed)
The medicine for your legs is called gabapentin.  Take 1 pill at night for the next 3 days, then take 2 pills at night for 3 days, then start taking 3 pills every night after that.  Come back to see me in about 3-4 weeks to see how your feet are doing. We will probably have to go up on the dose of your medicine at that point.  If you start having any trouble breathing with activities then let me know.

## 2015-03-27 NOTE — Assessment & Plan Note (Signed)
Essentially resolved. Dehydration improved. Still with mild/minimal skin tenting. Recommended to start adding supplemental protein shakes to help with this as well.

## 2015-03-27 NOTE — Assessment & Plan Note (Signed)
Seems to be secondary to CVA. Trial of gabapentin. Follow-up in a month to see if this is helped. We'll likely to increase at that visit.

## 2015-04-16 ENCOUNTER — Telehealth: Payer: Self-pay | Admitting: Clinical

## 2015-04-16 ENCOUNTER — Encounter: Payer: Self-pay | Admitting: Family Medicine

## 2015-04-16 ENCOUNTER — Ambulatory Visit (INDEPENDENT_AMBULATORY_CARE_PROVIDER_SITE_OTHER): Payer: Medicaid Other | Admitting: Family Medicine

## 2015-04-16 VITALS — BP 134/65 | HR 77 | Temp 98.2°F

## 2015-04-16 DIAGNOSIS — R202 Paresthesia of skin: Secondary | ICD-10-CM

## 2015-04-16 DIAGNOSIS — Z993 Dependence on wheelchair: Secondary | ICD-10-CM | POA: Diagnosis not present

## 2015-04-16 MED ORDER — GABAPENTIN 100 MG PO CAPS: 100.0000 mg | ORAL_CAPSULE | Freq: Three times a day (TID) | ORAL | Status: DC

## 2015-04-16 NOTE — Progress Notes (Signed)
Subjective:    Lee Irwin is a 82 y.o. male who [facility] for lower extremity parasthesias:  1.  LE paresthesias:  States this is much improved since addition of Neurontin.  Sleeping better at nighttime.  He is taking both ASA and neurontin on a daily basis.  Taking neuronin 3 times a day.  Denies any further symptoms at all.    ROS as above per HPI, otherwise neg.  Denies any melena or hematochezia.  Has been on ASA for years.   The following portions of the patient's history were reviewed and updated as appropriate: allergies, current medications, past medical history, family and social history, and problem list. Patient is a nonsmoker.    PMH reviewed.  Past Medical History  Diagnosis Date  . Wheelchair bound   . Arthritis   . Cataract   . Anisocoria   . COPD (chronic obstructive pulmonary disease) (HCC)    No past surgical history on file.  Medications reviewed. Current Outpatient Prescriptions  Medication Sig Dispense Refill  . Aspirin (ECOTRIN PO) Take 81 mg by mouth 3 (three) times daily after meals. Reported on 03/26/2015    . gabapentin (NEURONTIN) 100 MG capsule Take 1 capsule (100 mg total) by mouth 3 (three) times daily. 90 capsule 1   No current facility-administered medications for this visit.     Objective:   Physical Exam BP 134/65 mmHg  Pulse 77  Temp(Src) 98.2 F (36.8 C) (Oral)  Ht   Wt  Gen:  Alert, cooperative patient who appears stated age in no acute distress.  Vital signs reviewed. HEENT: EOMI,  MMM Neuro:  LE sensation intact BL LE's.    No results found for this or any previous visit (from the past 72 hour(s)).

## 2015-04-16 NOTE — Assessment & Plan Note (Signed)
Better with addition of neurontin.  No need to increase medicine.   FU in 3-4 months to ensure improvement.

## 2015-04-16 NOTE — Assessment & Plan Note (Signed)
Secondary to stroke.   Family asking to increase number of home aide hours.   Will see if we can do this.  Routing note to Child psychotherapistsocial worker.

## 2015-04-16 NOTE — Patient Instructions (Addendum)
You should continue to take the Neurontin to help with the nerve pain.    Come back to see me in 3 months or so.

## 2015-04-16 NOTE — Telephone Encounter (Signed)
CSW has placed PCS form in PCP box. CSW will fax form once signed.  Theresia BoughNorma Camrynn Mcclintic, MSW, LCSW [phone]

## 2015-04-17 NOTE — Telephone Encounter (Signed)
Completed and given to [name] to be faxed.

## 2015-04-19 ENCOUNTER — Encounter: Payer: Self-pay | Admitting: Licensed Clinical Social Worker

## 2015-04-19 ENCOUNTER — Telehealth: Payer: Self-pay | Admitting: Licensed Clinical Social Worker

## 2015-04-19 NOTE — Telephone Encounter (Signed)
Open in error

## 2015-04-19 NOTE — Progress Notes (Signed)
Patient ID: Lee Irwin, male   DOB: 05/04/1923, 82 y.o.   MRN: [id number]  Personal care service request form completed and signed by Dr. Gwendolyn GrantWalden.  CSW faxed completed form to [facility].   [name]. LCSWA [facility] Clinical Social Work  [phone] 9:12 AM

## 2015-09-03 ENCOUNTER — Ambulatory Visit (INDEPENDENT_AMBULATORY_CARE_PROVIDER_SITE_OTHER): Payer: Medicaid Other | Admitting: Family Medicine

## 2015-09-03 ENCOUNTER — Encounter: Payer: Self-pay | Admitting: Family Medicine

## 2015-09-03 VITALS — BP 122/76 | HR 75 | Temp 97.7°F

## 2015-09-03 DIAGNOSIS — R202 Paresthesia of skin: Secondary | ICD-10-CM | POA: Diagnosis not present

## 2015-09-03 DIAGNOSIS — Z23 Encounter for immunization: Secondary | ICD-10-CM

## 2015-09-03 DIAGNOSIS — Z8673 Personal history of transient ischemic attack (TIA), and cerebral infarction without residual deficits: Secondary | ICD-10-CM | POA: Diagnosis not present

## 2015-09-03 LAB — CBC
HEMATOCRIT: 43.1 % (ref 38.5–50.0)
Hemoglobin: 14.3 g/dL (ref 13.2–17.1)
MCH: 30 pg (ref 27.0–33.0)
MCHC: 33.2 g/dL (ref 32.0–36.0)
MCV: 90.4 fL (ref 80.0–100.0)
MPV: 11 fL (ref 7.5–12.5)
PLATELETS: 155 10*3/uL (ref 140–400)
RBC: 4.77 MIL/uL (ref 4.20–5.80)
RDW: 14.9 % (ref 11.0–15.0)
WBC: 7.1 10*3/uL (ref 3.8–10.8)

## 2015-09-03 LAB — COMPREHENSIVE METABOLIC PANEL
ALBUMIN: 4 g/dL (ref 3.6–5.1)
ALT: 9 U/L (ref 9–46)
AST: 18 U/L (ref 10–35)
Alkaline Phosphatase: 91 U/L (ref 40–115)
BILIRUBIN TOTAL: 0.6 mg/dL (ref 0.2–1.2)
BUN: 10 mg/dL (ref 7–25)
CALCIUM: 8.9 mg/dL (ref 8.6–10.3)
CHLORIDE: 104 mmol/L (ref 98–110)
CO2: 25 mmol/L (ref 20–31)
CREATININE: 1.04 mg/dL (ref 0.70–1.11)
Glucose, Bld: 98 mg/dL (ref 65–99)
Potassium: 3.9 mmol/L (ref 3.5–5.3)
SODIUM: 137 mmol/L (ref 135–146)
TOTAL PROTEIN: 8.5 g/dL — AB (ref 6.1–8.1)

## 2015-09-03 LAB — TSH: TSH: 3.93 mIU/L (ref 0.40–4.50)

## 2015-09-03 MED ORDER — GABAPENTIN 300 MG PO CAPS
300.0000 mg | ORAL_CAPSULE | Freq: Three times a day (TID) | ORAL | 3 refills | Status: DC
Start: 2015-09-03 — End: 2016-01-28

## 2015-09-03 NOTE — Patient Instructions (Addendum)
Take the gabapentin three times a day.  THis is a higher dose.  I will have the social worker apply for more hours for care services.  If you have not heard something by Friday about this, don't wait.  Call and ask.    It was good to see you today.  Come back in 6 weeks to check how you're doing on the higher dose.

## 2015-09-03 NOTE — Progress Notes (Signed)
Subjective:   [place] interpreter used for entire visit.   Lee Irwin is a 82 y.o. male who presents to [facility] today for burning:  1.  Burning all over his body:  States that initially just feet (see prior OV notes for details on this).  Prescribed gabapentin with initial relief.  Has now extended to legs, fingers, and hands.  States gabapentin no longer working.  Worse at night.  No fevers or chills.  No weight gain/loss.  No polyuria/polydipsia.    2.  Care services:  Asking for more services at home.  He is homebound and wheelchair bound since a stroke which occurred back in [place].  The family has difficulty caring for him due to the degree of his needs and fact he has mobility issues.  They are asking to see if they can get increased home care service while at home.    ROS as above per HPI, otherwise neg.    The following portions of the patient's history were reviewed and updated as appropriate: allergies, current medications, past medical history, family and social history, and problem list. Patient is a nonsmoker.    PMH reviewed.  Past Medical History:  Diagnosis Date  . 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  . Arthritis   . Cataract   . COPD (chronic obstructive pulmonary disease) (HCC)   . Wheelchair bound    No past surgical history on file.  Medications reviewed. Current Outpatient Prescriptions  Medication Sig Dispense Refill  . Aspirin (ECOTRIN PO) Take 81 mg by mouth 3 (three) times daily after meals. Reported on 03/26/2015    . gabapentin (NEURONTIN) 100 MG capsule Take 1 capsule (100 mg total) by mouth 3 (three) times daily. 90 capsule 6   No current facility-administered medications for this visit.      Objective:   Physical Exam BP (!) 157/64 (BP Location: Left Arm, Patient Position: Sitting, Cuff Size: Normal)   Pulse 75   Temp 97.7 F (36.5 C) (Oral)  Gen:  Alert, cooperative patient who appears  stated age in no acute distress.  Vital signs reviewed. HEENT: EOMI,  MMM Cardiac:  Regular rate and rhythm without murmur auscultated.  Good S1/S2. Pulm:  Clear to auscultation bilaterally with good air movement.  No wheezes or rales noted.   Exts: Chronic LE contractures noted.   Neuro:  Wheelchair bound.  Awake and alert.   No results found for this or any previous visit (from the past 72 hour(s)).

## 2015-09-04 ENCOUNTER — Encounter: Payer: Self-pay | Admitting: Family Medicine

## 2015-09-04 NOTE — Assessment & Plan Note (Signed)
Initially helped with gabapentin.  Now worsened again.  Likely autonomic neuropathy.   Plan to re-initiate gabapentin at higher dose.  No red flags.   FU in 4 - 6 weeks to assess for improvement.

## 2015-09-04 NOTE — Assessment & Plan Note (Signed)
Neurological insult while in [place], sudden loss of lower extremity strength, with persistent contractures since then.  He is bed/wheelchair bound secondary to this.  I will forward to our social worker here in clinic to see if she has any ideas about increasing home aide services.

## 2015-09-05 NOTE — Progress Notes (Signed)
Consult from MD to inquire about additional Personal Care Service hours for patient.   Call to [facility] to inquire about more hours for patient.  Patient is currently receiving 80 hours per month.  Per Loraine Leriche at [facility] this is the maximum number of hours he is able to receive.    Lee Hines, LCSW Licensed Clinical Social Worker [facility]   [phone] 4:13 PM

## 2015-09-06 ENCOUNTER — Encounter: Payer: Self-pay | Admitting: Family Medicine

## 2015-09-10 ENCOUNTER — Telehealth: Payer: Self-pay | Admitting: Family Medicine

## 2015-09-10 NOTE — Telephone Encounter (Signed)
Discussed case with Sammuel Hines our social worker.  She contacted the patient's home agency and the patient is at the maximum number of hours he can obtain through that agency.  I will send a letter to the family to let them know.

## 2016-01-21 ENCOUNTER — Ambulatory Visit (INDEPENDENT_AMBULATORY_CARE_PROVIDER_SITE_OTHER): Payer: Medicaid Other | Admitting: Family Medicine

## 2016-01-21 ENCOUNTER — Encounter: Payer: Self-pay | Admitting: Family Medicine

## 2016-01-21 VITALS — BP 110/58 | HR 83 | Temp 97.7°F

## 2016-01-21 DIAGNOSIS — Z993 Dependence on wheelchair: Secondary | ICD-10-CM | POA: Diagnosis not present

## 2016-01-21 DIAGNOSIS — R202 Paresthesia of skin: Secondary | ICD-10-CM

## 2016-01-21 DIAGNOSIS — J069 Acute upper respiratory infection, unspecified: Secondary | ICD-10-CM | POA: Insufficient documentation

## 2016-01-21 DIAGNOSIS — J439 Emphysema, unspecified: Secondary | ICD-10-CM | POA: Diagnosis not present

## 2016-01-21 DIAGNOSIS — Z23 Encounter for immunization: Secondary | ICD-10-CM | POA: Diagnosis present

## 2016-01-21 DIAGNOSIS — B9789 Other viral agents as the cause of diseases classified elsewhere: Secondary | ICD-10-CM | POA: Diagnosis not present

## 2016-01-21 MED ORDER — IPRATROPIUM BROMIDE 0.03 % NA SOLN: 2.0000 | Freq: Two times a day (BID) | NASAL | 0 refills | Status: DC

## 2016-01-21 NOTE — Assessment & Plan Note (Signed)
Control. Continue gabapentin. States he does not need any refills.

## 2016-01-21 NOTE — Assessment & Plan Note (Signed)
Mild. Likely viral URI. Treating with Atrovent symptomatically. Follow-up if worsening or no improvement in the next week.

## 2016-01-21 NOTE — Patient Instructions (Signed)
It was good to see you today  Go to [facility] for the wheelchair.  Take the Atrovent nasal spray twice daily for 1 week to help with the cold.    I'll see him back in 3 - 6 months as along as things are doing well.

## 2016-01-21 NOTE — Progress Notes (Signed)
Subjective:   Grandson provided interpretation. Patient refused video interpreter.  Lee Irwin is a 82 y.o. male who [facility] for FU for paresthesias:  1.  Paresthesias:  Doing well.  Much improved on increased dose of gabapentin. Has no further issues with paresthesias. No leg pain. Does have chronic leg weakness which is at his baseline.  #2. URI symptoms: Present for the past 2-3 days. Mostly nasal drainage. Some sinus congestion. Mild cough. No sore throat. Eating and drinking well. No fevers or chills.   ROS as above per HPI.   The following portions of the patient's history were reviewed and updated as appropriate: allergies, current medications, past medical history, family and social history, and problem list. Patient is a nonsmoker.    PMH reviewed.  Past Medical History:  Diagnosis Date  . 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  . Arthritis   . Cataract   . COPD (chronic obstructive pulmonary disease) (HCC)   . Wheelchair bound    No past surgical history on file.  Medications reviewed. Current Outpatient Prescriptions  Medication Sig Dispense Refill  . Aspirin (ECOTRIN PO) Take 81 mg by mouth 3 (three) times daily after meals. Reported on 03/26/2015    . gabapentin (NEURONTIN) 300 MG capsule Take 1 capsule (300 mg total) by mouth 3 (three) times daily. 90 capsule 3   No current facility-administered medications for this visit.      Objective:   Physical Exam BP (!) 110/58   Pulse 83   Temp 97.7 F (36.5 C) (Other (Comment))   SpO2 (!) 85%  Gen:  Patient sitting on exam table, appears stated age in no acute distress Head: Normocephalic atraumatic Eyes: EOMI, PERRL, sclera and conjunctiva non-erythematous Ears:  Canals clear bilaterally.  TMs pearly gray bilaterally without erythema or bulging.   Nose:  With dry crusting and some clear nasal drainage bilaterally. Mouth: Mucosa membranes moist.  Tonsils +2, nonenlarged, non-erythematous. Neck: No cervical lymphadenopathy noted Heart:  RRR, no murmurs auscultated. Pulm:  Clear to auscultation bilaterally with good air movement.  No wheezes or rales noted.      No results found for this or any previous visit (from the past 72 hour(s)).

## 2016-01-21 NOTE — Assessment & Plan Note (Signed)
Pulse ox is 85%. Patient is on oxygen. He has no symptoms of dyspnea. He does not walk. Can provide written prescription for oxygen today.

## 2016-01-21 NOTE — Assessment & Plan Note (Signed)
Wheelchair is fairly old. I did provide him a [facility].

## 2016-01-28 ENCOUNTER — Other Ambulatory Visit: Payer: Self-pay | Admitting: Family Medicine

## 2016-02-13 ENCOUNTER — Telehealth: Payer: Self-pay | Admitting: Family Medicine

## 2016-02-13 NOTE — Telephone Encounter (Signed)
Junious DresserConnie from [facility] called and would like to know if the patient has any dieting restrictions. Please call her at [phone] and if she doesn't answer you can leave a message since this is a secured phone. jw

## 2016-02-13 NOTE — Telephone Encounter (Signed)
Please call the number and state the patient has no eating restrictions.  Thanks!  JW

## 2016-02-14 NOTE — Telephone Encounter (Signed)
Called and gave information to Lee Irwin. Lee Irwin, CMA D

## 2016-02-20 ENCOUNTER — Other Ambulatory Visit: Payer: Self-pay | Admitting: Family Medicine

## 2016-06-18 ENCOUNTER — Encounter: Payer: Self-pay | Admitting: Family Medicine

## 2016-06-18 ENCOUNTER — Ambulatory Visit (INDEPENDENT_AMBULATORY_CARE_PROVIDER_SITE_OTHER): Payer: Medicaid Other | Admitting: Family Medicine

## 2016-06-18 VITALS — BP 110/70 | HR 93 | Temp 98.4°F

## 2016-06-18 DIAGNOSIS — J069 Acute upper respiratory infection, unspecified: Secondary | ICD-10-CM | POA: Diagnosis present

## 2016-06-18 DIAGNOSIS — R202 Paresthesia of skin: Secondary | ICD-10-CM

## 2016-06-18 MED ORDER — IPRATROPIUM BROMIDE 0.03 % NA SOLN: 2.0000 | Freq: Two times a day (BID) | NASAL | 0 refills | Status: AC

## 2016-06-18 MED ORDER — GABAPENTIN 300 MG PO CAPS: 300.0000 mg | ORAL_CAPSULE | Freq: Three times a day (TID) | ORAL | 3 refills | Status: AC

## 2016-06-18 NOTE — Assessment & Plan Note (Signed)
Patient is well appearing. Likely mild URI vs seasonal allergies. Per patient has done well in the past on atrovent for symptomatic relief. - Refilled atrovent and patient given return precautions.

## 2016-06-18 NOTE — Assessment & Plan Note (Signed)
Refilled gabapentin since patient was out of medication at home and advised him to follow up with PCP.

## 2016-06-18 NOTE — Patient Instructions (Signed)
It was great to see you again!  For your cold  - Please take atrovent nasal spray for the next 3-6 days while you get over this cold.  I have refilled your gabapentin prescription until you can follow up with Dr. Gwendolyn GrantWalden.  Take care and seek immediate care sooner if you develop any concerns.   Dr. Leland HerElsia J Yoo, DO  Family Medicine

## 2016-06-18 NOTE — Progress Notes (Signed)
    Subjective:  Lee Irwin is a 83 y.o. male who [facility]. He is accompanied by a caretaker. History taken via Nepali video interpreter.  HPI:  URI symptoms: Has had cough for 3 days, productive with whitish sputum, mild sore throat. No nasal congestion or rhinorrhea. Has had some sneezing. No fever/chils. No n/v/d. Similar to cold symptoms he has had in the past. Denies h/o seasonal allergies. No sick contacts.  Paresthesias: Chronic problem, has done well on gabapentin in the past. Today his pain is not well controlled but not uncontrolled either. Needs refill of his gabapentin.   ROS: Per HPI  Objective:  Physical Exam: BP 110/70   Pulse 93   Temp 98.4 F (36.9 C) (Oral)   SpO2 96%   Gen: NAD, resting comfortably in chair HEENT: sclera and conjunctiva non-erythematous. TMs without erythema or retraction b/l. Oropharynx nonerythematous with some clear drainage. MMM. Neck: supple, no lymphadenopathy  CV: RRR with no murmurs appreciated Pulm: NWOB, CTAB with no crackles, wheezes, or rhonchi GI: Normal bowel sounds present. Soft, Nontender, Nondistended.   Assessment/Plan:  URI (upper respiratory infection) Patient is well appearing. Likely mild URI vs seasonal allergies. Per patient has done well in the past on atrovent for symptomatic relief. - Refilled atrovent and patient given return precautions.  Paresthesias Refilled gabapentin since patient was out of medication at home and advised him to follow up with PCP.   Lee HerElsia J Azani Brogdon, DO PGY-1, [facility] 06/18/2016 10:30 AM

## 2016-09-04 ENCOUNTER — Ambulatory Visit (INDEPENDENT_AMBULATORY_CARE_PROVIDER_SITE_OTHER): Payer: Medicaid Other | Admitting: Internal Medicine

## 2016-09-04 VITALS — BP 92/50 | HR 66 | Temp 98.1°F

## 2016-09-04 DIAGNOSIS — R509 Fever, unspecified: Secondary | ICD-10-CM | POA: Insufficient documentation

## 2016-09-04 DIAGNOSIS — H269 Unspecified cataract: Secondary | ICD-10-CM | POA: Diagnosis present

## 2016-09-04 MED ORDER — ONDANSETRON HCL 4 MG PO TABS: 4.0000 mg | ORAL_TABLET | Freq: Three times a day (TID) | ORAL | 0 refills | Status: AC | PRN

## 2016-09-04 NOTE — Patient Instructions (Signed)
I am going to give you a referral to the eye doctor. I will give you zofran for nausea. Follow up with Dr. Gwendolyn GrantWalden. If you have fevers again please return to re-evaluation.

## 2016-09-04 NOTE — Assessment & Plan Note (Signed)
Decreasing vision, likely due to large left eye cataract previously was supposed to see ophthalmology per review of Dr. Tyson AliasWalden's notes. Patient is unsure if he saw ophthalmology or not. Would like to be referred to them. Other possibly cause of vision issues in patient at this age range include macular degeneration  - Will place referral

## 2016-09-04 NOTE — Progress Notes (Signed)
   [facility] Noralee CharsAsiyah Raveena Hebdon, MD Phone: [phone]  Reason For Visit: SDA for Fever   On Monday and Wednesday, patient was shaking and stating he felt cold/hot, had a fever. Patient vomits sometimes. Denies any congestion, cough, sore throat, nasal congestion. Patient feels warm or burning sensation all over the body, has been seen by Dr. Gwendolyn GrantWalden for this issue. Patient denies any diaphoresis, or sweating at night. Patient indicates nausea, dizziness - symptoms he has all the time. Patient left eye has been tearing and does not see as well out of it - worsening eye sight, with hx of catarct. Patient has not had an appetite has only been eating  1 time a day - only soft watery rice. Patient denies any dysuria. Patient vomited last week - has been going about 2 weeks.   Past Medical History Reviewed problem list.  Medications- reviewed and updated No additions to family history  Objective: BP (!) 92/50   Pulse 66   Temp 98.1 F (36.7 C) (Oral)  Gen: NAD, alert, cooperative with exam Eye: Large cataract in left eye, right with small cataract as well  Cardio: regular rate and rhythm, S1S2 heard, no murmurs appreciated Pulm: clear to auscultation bilaterally, no wheezes, rhonchi or rales Extremities: warm, well perfused, No edema Skin: dry, intact, no rashes or lesions  Assessment/Plan: See problem based a/p  Fever, unspecified History of chills on Monday and Wednesday. Vomited at some point last week possibly 2 times. Has had decreased appetite just drinking and eating soft rice. Symptoms have since resolved per patient. No dysuria, no shortness of breath or cough, no rashes or painful lesions. Likely URI type symptoms. -Provided Zofran as needed for nausea if this returns -CBC with differential -Follow up with PCP as soon as possible -Discussed with patient follow-up if return of symptoms     Cataract Decreasing vision, likely due to large left eye cataract  previously was supposed to see ophthalmology per review of Dr. Tyson AliasWalden's notes. Patient is unsure if he saw ophthalmology or not. Would like to be referred to them. Other possibly cause of vision issues in patient at this age range include macular degeneration  - Will place referral

## 2016-09-04 NOTE — Assessment & Plan Note (Signed)
History of chills on Monday and Wednesday. Vomited at some point last week possibly 2 times. Has had decreased appetite just drinking and eating soft rice. Symptoms have since resolved per patient. No dysuria, no shortness of breath or cough, no rashes or painful lesions. Likely URI type symptoms. -Provided Zofran as needed for nausea if this returns -CBC with differential -Follow up with PCP as soon as possible -Discussed with patient follow-up if return of symptoms

## 2016-09-05 LAB — CBC WITH DIFFERENTIAL/PLATELET
Basophils Absolute: 0 10*3/uL (ref 0.0–0.2)
Basos: 0 %
EOS (ABSOLUTE): 0.1 10*3/uL (ref 0.0–0.4)
EOS: 1 %
HEMATOCRIT: 35.8 % — AB (ref 37.5–51.0)
HEMOGLOBIN: 11.3 g/dL — AB (ref 13.0–17.7)
IMMATURE GRANS (ABS): 0.5 10*3/uL — AB (ref 0.0–0.1)
IMMATURE GRANULOCYTES: 4 %
LYMPHS ABS: 2 10*3/uL (ref 0.7–3.1)
Lymphs: 17 %
MCH: 28.5 pg (ref 26.6–33.0)
MCHC: 31.6 g/dL (ref 31.5–35.7)
MCV: 90 fL (ref 79–97)
MONOS ABS: 0.8 10*3/uL (ref 0.1–0.9)
Monocytes: 7 %
Neutrophils Absolute: 8.7 10*3/uL — ABNORMAL HIGH (ref 1.4–7.0)
Neutrophils: 71 %
Platelets: 214 10*3/uL (ref 150–379)
RBC: 3.97 x10E6/uL — ABNORMAL LOW (ref 4.14–5.80)
RDW: 16.7 % — AB (ref 12.3–15.4)
WBC: 12.2 10*3/uL — ABNORMAL HIGH (ref 3.4–10.8)

## 2016-09-08 ENCOUNTER — Emergency Department (HOSPITAL_COMMUNITY): Payer: Medicaid Other

## 2016-09-08 ENCOUNTER — Inpatient Hospital Stay (HOSPITAL_COMMUNITY)
Admission: EM | Admit: 2016-09-08 | Discharge: 2016-10-10 | DRG: 871 | Disposition: E | Payer: Medicaid Other | Attending: Internal Medicine | Admitting: Internal Medicine

## 2016-09-08 ENCOUNTER — Encounter (HOSPITAL_COMMUNITY): Payer: Self-pay | Admitting: *Deleted

## 2016-09-08 DIAGNOSIS — R6520 Severe sepsis without septic shock: Secondary | ICD-10-CM | POA: Diagnosis present

## 2016-09-08 DIAGNOSIS — R945 Abnormal results of liver function studies: Secondary | ICD-10-CM | POA: Diagnosis present

## 2016-09-08 DIAGNOSIS — Z515 Encounter for palliative care: Secondary | ICD-10-CM | POA: Diagnosis not present

## 2016-09-08 DIAGNOSIS — I4891 Unspecified atrial fibrillation: Secondary | ICD-10-CM | POA: Diagnosis present

## 2016-09-08 DIAGNOSIS — J189 Pneumonia, unspecified organism: Secondary | ICD-10-CM | POA: Diagnosis present

## 2016-09-08 DIAGNOSIS — R64 Cachexia: Secondary | ICD-10-CM | POA: Diagnosis present

## 2016-09-08 DIAGNOSIS — J439 Emphysema, unspecified: Secondary | ICD-10-CM | POA: Diagnosis present

## 2016-09-08 DIAGNOSIS — Z6820 Body mass index (BMI) 20.0-20.9, adult: Secondary | ICD-10-CM

## 2016-09-08 DIAGNOSIS — Z87891 Personal history of nicotine dependence: Secondary | ICD-10-CM | POA: Diagnosis not present

## 2016-09-08 DIAGNOSIS — H5702 Anisocoria: Secondary | ICD-10-CM | POA: Diagnosis present

## 2016-09-08 DIAGNOSIS — Z993 Dependence on wheelchair: Secondary | ICD-10-CM | POA: Diagnosis not present

## 2016-09-08 DIAGNOSIS — I471 Supraventricular tachycardia: Secondary | ICD-10-CM | POA: Diagnosis not present

## 2016-09-08 DIAGNOSIS — Z66 Do not resuscitate: Secondary | ICD-10-CM | POA: Diagnosis not present

## 2016-09-08 DIAGNOSIS — J9601 Acute respiratory failure with hypoxia: Secondary | ICD-10-CM | POA: Diagnosis not present

## 2016-09-08 DIAGNOSIS — D649 Anemia, unspecified: Secondary | ICD-10-CM | POA: Diagnosis present

## 2016-09-08 DIAGNOSIS — K921 Melena: Secondary | ICD-10-CM | POA: Diagnosis present

## 2016-09-08 DIAGNOSIS — E872 Acidosis: Secondary | ICD-10-CM | POA: Diagnosis present

## 2016-09-08 DIAGNOSIS — A419 Sepsis, unspecified organism: Secondary | ICD-10-CM | POA: Diagnosis present

## 2016-09-08 DIAGNOSIS — K219 Gastro-esophageal reflux disease without esophagitis: Secondary | ICD-10-CM | POA: Diagnosis present

## 2016-09-08 DIAGNOSIS — N179 Acute kidney failure, unspecified: Secondary | ICD-10-CM | POA: Diagnosis present

## 2016-09-08 DIAGNOSIS — Z8673 Personal history of transient ischemic attack (TIA), and cerebral infarction without residual deficits: Secondary | ICD-10-CM

## 2016-09-08 DIAGNOSIS — Z7982 Long term (current) use of aspirin: Secondary | ICD-10-CM | POA: Diagnosis not present

## 2016-09-08 DIAGNOSIS — E876 Hypokalemia: Secondary | ICD-10-CM | POA: Diagnosis present

## 2016-09-08 DIAGNOSIS — R109 Unspecified abdominal pain: Secondary | ICD-10-CM | POA: Diagnosis present

## 2016-09-08 DIAGNOSIS — R7989 Other specified abnormal findings of blood chemistry: Secondary | ICD-10-CM | POA: Diagnosis present

## 2016-09-08 LAB — URINALYSIS, ROUTINE W REFLEX MICROSCOPIC
Bacteria, UA: NONE SEEN
Bilirubin Urine: NEGATIVE
GLUCOSE, UA: 50 mg/dL — AB
Ketones, ur: 20 mg/dL — AB
LEUKOCYTES UA: NEGATIVE
NITRITE: NEGATIVE
PROTEIN: 30 mg/dL — AB
SPECIFIC GRAVITY, URINE: 1.011 (ref 1.005–1.030)
pH: 5 (ref 5.0–8.0)

## 2016-09-08 LAB — CBC
HEMATOCRIT: 38.1 % — AB (ref 39.0–52.0)
HEMOGLOBIN: 12.4 g/dL — AB (ref 13.0–17.0)
MCH: 29.7 pg (ref 26.0–34.0)
MCHC: 32.5 g/dL (ref 30.0–36.0)
MCV: 91.1 fL (ref 78.0–100.0)
Platelets: 129 10*3/uL — ABNORMAL LOW (ref 150–400)
RBC: 4.18 MIL/uL — AB (ref 4.22–5.81)
RDW: 18.6 % — ABNORMAL HIGH (ref 11.5–15.5)
WBC: 30.8 10*3/uL — AB (ref 4.0–10.5)

## 2016-09-08 LAB — I-STAT CG4 LACTIC ACID, ED
LACTIC ACID, VENOUS: 10.68 mmol/L — AB (ref 0.5–1.9)
LACTIC ACID, VENOUS: 5.12 mmol/L — AB (ref 0.5–1.9)

## 2016-09-08 LAB — COMPREHENSIVE METABOLIC PANEL
ALT: 126 U/L — AB (ref 17–63)
ANION GAP: 24 — AB (ref 5–15)
AST: 447 U/L — ABNORMAL HIGH (ref 15–41)
Albumin: 2.4 g/dL — ABNORMAL LOW (ref 3.5–5.0)
Alkaline Phosphatase: 97 U/L (ref 38–126)
BUN: 35 mg/dL — ABNORMAL HIGH (ref 6–20)
CHLORIDE: 99 mmol/L — AB (ref 101–111)
CO2: 12 mmol/L — AB (ref 22–32)
Calcium: 7.8 mg/dL — ABNORMAL LOW (ref 8.9–10.3)
Creatinine, Ser: 2.05 mg/dL — ABNORMAL HIGH (ref 0.61–1.24)
GFR calc non Af Amer: 27 mL/min — ABNORMAL LOW (ref >60)
GFR, EST AFRICAN AMERICAN: 31 mL/min — AB (ref >60)
Glucose, Bld: 84 mg/dL (ref 65–99)
POTASSIUM: 2.9 mmol/L — AB (ref 3.5–5.1)
SODIUM: 135 mmol/L (ref 135–145)
Total Bilirubin: 2.5 mg/dL — ABNORMAL HIGH (ref 0.3–1.2)
Total Protein: 7.1 g/dL (ref 6.5–8.1)

## 2016-09-08 LAB — TYPE AND SCREEN
ABO/RH(D): O POS
Antibody Screen: NEGATIVE

## 2016-09-08 MED ORDER — VANCOMYCIN HCL IN DEXTROSE 1-5 GM/200ML-% IV SOLN
1000.0000 mg | Freq: Once | INTRAVENOUS | Status: AC
  Administered 2016-09-08: 1000 mg via INTRAVENOUS
  Filled 2016-09-08: qty 200

## 2016-09-08 MED ORDER — SODIUM CHLORIDE 0.9 % IV BOLUS (SEPSIS)
1000.0000 mL | Freq: Once | INTRAVENOUS | Status: AC
  Administered 2016-09-08: 1000 mL via INTRAVENOUS

## 2016-09-08 MED ORDER — PIPERACILLIN-TAZOBACTAM 3.375 G IVPB
3.3750 g | Freq: Three times a day (TID) | INTRAVENOUS | Status: DC
Start: 2016-09-09 — End: 2016-09-09
  Administered 2016-09-09: 3.375 g via INTRAVENOUS
  Filled 2016-09-08: qty 50

## 2016-09-08 MED ORDER — PIPERACILLIN-TAZOBACTAM 3.375 G IVPB 30 MIN
3.3750 g | Freq: Once | INTRAVENOUS | Status: AC
  Administered 2016-09-08: 3.375 g via INTRAVENOUS
  Filled 2016-09-08: qty 50

## 2016-09-08 NOTE — ED Notes (Signed)
[facility] Expected date:  Expected time:  Means of arrival:  Comments: EMS 90s, GI bleed?

## 2016-09-08 NOTE — ED Provider Notes (Signed)
[facility]-EMERGENCY DEPT Provider Note   CSN: [id number] Arrival date & time: 2016-10-02  1707     History   Chief Complaint Chief Complaint  Patient presents with  . Abdominal Pain  . Rectal Bleeding  . Code Sepsis    HPI Lee Irwin is a 83 y.o. male.  HPI  Patient presents with his grandchildren who assists with the history of present illness.  The patient is [place], speaks no [place].  The patient's granddaughter notes that over the past 2 or 3 days the patient has been weaker than usual, with new vomiting. Patient had one episode of bright red blood per rectum earlier today, and another upon arrival to the emergency department.  The patient himself is awake and alert, following the conversation, and via interpreter states that he feels unwell. Patient is known to have history of pulmonary disease and is wheelchair bound. Since onset no clear alleviating or exacerbating factors.     Past Medical History:  Diagnosis Date  . 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  . Arthritis   . Cataract   . COPD (chronic obstructive pulmonary disease) (HCC)   . Wheelchair bound     Patient Active Problem List   Diagnosis Date Noted  . Fever, unspecified 09/04/2016  . URI (upper respiratory infection) 01/21/2016  . Emphysema of lung (HCC) 03/26/2015  . Lung nodule < 6cm on CT 12/11/2014  . History of CVA (cerebrovascular accident) 11/01/2014  . Paresthesias 09/28/2014  . Cataract 08/31/2014  . GERD (gastroesophageal reflux disease) 08/31/2014  . Muscle wasting 08/10/2014  . Immigrant with language difficulty 08/10/2014  . Bilateral knee contractures 08/10/2014  . TB (tuberculosis), treated 08/10/2014  . Wheelchair bound 08/10/2014  . Anisocoria 08/10/2014    History reviewed. No pertinent surgical history.     Home Medications    Prior to Admission medications   Medication Sig Start Date End Date Taking? Authorizing  Provider  gabapentin (NEURONTIN) 300 MG capsule Take 1 capsule (300 mg total) by mouth 3 (three) times daily. 06/18/16  Yes Jeneen Rinks J, DO  ibuprofen (ADVIL,MOTRIN) 200 MG tablet Take 200 mg by mouth every 6 (six) hours as needed.   Yes [provider]  ipratropium (ATROVENT) 0.03 % nasal spray Place 2 sprays into both nostrils every 12 (twelve) hours. 06/18/16  Yes Jeneen Rinks J, DO  ondansetron (ZOFRAN) 4 MG tablet Take 1 tablet (4 mg total) by mouth every 8 (eight) hours as needed for nausea or vomiting. 09/04/16  Yes Mikell, Antionette Poles, MD  Aspirin (ECOTRIN PO) Take 81 mg by mouth 3 (three) times daily after meals. Reported on 03/26/2015    [provider]    Family History No family history on file.  Social History Social History  Substance Use Topics  . Smoking status: Former Smoker    Quit date: 06/19/2003  . Smokeless tobacco: Never Used  . Alcohol use No     Allergies   Patient has no known allergies.   Review of Systems Review of Systems  Constitutional: Positive for diaphoresis.  HENT:       Per HPI, otherwise negative  Respiratory:       Per HPI, otherwise negative  Cardiovascular:       Per HPI, otherwise negative  Gastrointestinal: Positive for anal bleeding and nausea. Negative for vomiting.  Endocrine:       Negative aside from HPI  Genitourinary:       Neg  aside from HPI   Musculoskeletal:       Per HPI, otherwise negative  Skin: Negative.   Neurological: Positive for weakness.     Physical Exam Updated Vital Signs BP (!) 113/56   Pulse 95   Temp (!) 95.4 F (35.2 C) (Rectal)   Resp 17   Ht 5\' 11"  (1.803 m)   Wt 65.8 kg (145 lb)   SpO2 91%   BMI 20.22 kg/m   Physical Exam  Constitutional: He is oriented to person, place, and time.  Frail, ill-appearing, uncomfortable appearing male, diaphoretic, cool  HENT:  Head: Normocephalic and atraumatic.  Eyes: Conjunctivae and EOM are normal.  Cardiovascular: Regular rhythm.   Tachycardia present.   Pulmonary/Chest: No stridor. Tachypnea noted. He has decreased breath sounds.      Abdominal: He exhibits no distension. There is tenderness.  Genitourinary:  Genitourinary Comments: Grossly positive stool  Musculoskeletal: He exhibits no edema.  Neurological: He is alert and oriented to person, place, and time. He displays atrophy.  Skin: Skin is warm and dry.  Psychiatric: He has a normal mood and affect.  Nursing note and vitals reviewed.    ED Treatments / Results  Labs (all labs ordered are listed, but only abnormal results are displayed) Labs Reviewed  COMPREHENSIVE METABOLIC PANEL - Abnormal; Notable for the following:       Result Value   Potassium 2.9 (*)    Chloride 99 (*)    CO2 12 (*)    BUN 35 (*)    Creatinine, Ser 2.05 (*)    Calcium 7.8 (*)    Albumin 2.4 (*)    AST 447 (*)    ALT 126 (*)    Total Bilirubin 2.5 (*)    GFR calc non Af Amer 27 (*)    GFR calc Af Amer 31 (*)    Anion gap 24 (*)    All other components within normal limits  CBC - Abnormal; Notable for the following:    WBC 30.8 (*)    RBC 4.18 (*)    Hemoglobin 12.4 (*)    HCT 38.1 (*)    RDW 18.6 (*)    Platelets 129 (*)    All other components within normal limits  URINALYSIS, ROUTINE W REFLEX MICROSCOPIC - Abnormal; Notable for the following:    Glucose, UA 50 (*)    Hgb urine dipstick MODERATE (*)    Ketones, ur 20 (*)    Protein, ur 30 (*)    Squamous Epithelial / LPF 0-5 (*)    All other components within normal limits  I-STAT CG4 LACTIC ACID, ED - Abnormal; Notable for the following:    Lactic Acid, Venous 10.68 (*)    All other components within normal limits  CULTURE, BLOOD (ROUTINE X 2)  CULTURE, BLOOD (ROUTINE X 2)  CREATININE, SERUM  I-STAT CG4 LACTIC ACID, ED  TYPE AND SCREEN    EKG  EKG Interpretation  Date/Time:  Tuesday September 08 2016 18:37:28 EDT Ventricular Rate:  89 PR Interval:    QRS Duration: 129 QT Interval:  438 QTC  Calculation: 533 R Axis:   55 Text Interpretation:  Sinus rhythm Atrial premature complex Probable left atrial enlargement Right bundle branch block Abnormal ekg Confirmed by Gerhard Munch [phone]) on 08/16/2016 7:16:22 PM       Radiology Ct Abdomen Pelvis Wo Contrast  Result Date: 09/02/2016 CLINICAL DATA:  83 year old male with rectal bleed and sepsis and abdominal pain. EXAM: CT CHEST, ABDOMEN  AND PELVIS WITHOUT CONTRAST TECHNIQUE: Multidetector CT imaging of the chest, abdomen and pelvis was performed following the standard protocol without IV contrast. COMPARISON:  Chest radiograph dated 20-Jun-2016 and CT dated 11/06/2014 FINDINGS: Evaluation of this exam is limited in the absence of intravenous contrast. Evaluation is also limited due to respiratory motion artifact. CT CHEST FINDINGS Cardiovascular: There is mild cardiomegaly. No pericardial effusion. Mild coronary vascular calcification involving the left circumflex artery. There is mild atherosclerotic calcification of the thoracic aorta. No aneurysmal dilatation. Mild prominence of the central pulmonary arteries suggestive of underlying pulmonary hypertension. Evaluation of the vasculature is limited in the absence of intravenous contrast. Mediastinum/Nodes: There is a mildly enlarged lymph node in the prevascular space measuring 15 mm in short axis. Aortopulmonic window lymph node measures 16 mm in short axis. Confluent subcarinal lymph node or adenopathy noted. Esophagus is grossly unremarkable as visualized. There is a 2 cm hypodense nodule in the right thyroid gland. Lungs/Pleura: There is severe centrilobular and paraseptal emphysema as well as areas of peribronchovascular and subpleural reticulation associated bronchiectasis consistent with interstitial lung disease. There are large patchy areas of airspace consolidation involving upper and lower lobes bilaterally, new compared to the prior CT of 2016 and concerning for superimposed  infection. Correlation with clinical exam and follow-up after treatment recommended. There is probable small right pleural effusion versus pleural thickening. No pneumothorax. The central airways are patent. Musculoskeletal: There is degenerative changes of the spine. No acute osseous pathology. CT ABDOMEN PELVIS FINDINGS There is no intra-abdominal free air or free fluid. Hepatobiliary: There is diffuse fatty infiltration of the liver. No intrahepatic biliary ductal dilatation. Small layering sludge may be present within the gallbladder. No pericholecystic fluid. Pancreas: Unremarkable. No pancreatic ductal dilatation or surrounding inflammatory changes. Spleen: Normal in size without focal abnormality. Adrenals/Urinary Tract: The adrenal glands, kidneys, and the visualized ureters appear unremarkable. There is mild diffuse thickened and trabecular appearance of the bladder wall, likely related to chronic bladder outlet obstruction or chronic infection. Correlation with urinalysis recommended to exclude acute cystitis. Stomach/Bowel: There is no evidence of bowel obstruction or active inflammation. The appendix is not visualized with certainty. No inflammatory changes identified in the right lower quadrant. Vascular/Lymphatic: Advanced aortoiliac atherosclerotic disease. Evaluation of the aorta and IVC is limited in the absence of intravenous contrast. No portal venous gas identified. There is no adenopathy. Reproductive: The prostate and seminal vesicles are grossly unremarkable. No pelvic mass. Other: Mild diffuse mesenteric stranding. There is a small calcified fat containing umbilical hernia, likely sequela of chronic inflammation or incarceration. No fluid collection. Musculoskeletal: There is osteopenia with degenerative changes of the spine. Grade 1 L4-L5 retrolisthesis. A 9 mm sclerotic focus in the L4 vertebra is indeterminate but may represent a bone island. There is mild L2 compression deformity, age  indeterminate, likely chronic. No definite acute fracture. No retropulsed fragment. IMPRESSION: 1. Extensive consolidative changes of the lungs concerning for pneumonia. Clinical correlation and follow-up to resolution recommended. There is a background of emphysema and interstitial lung disease. 2. Mild cardiomegaly. 3. No acute intra-abdominal or pelvic pathology. No bowel obstruction or active inflammation. 4. Fatty liver. 5. Mildly thickened and trabeculated appearance of the bladder wall, likely related to chronic bladder outlet obstruction. Correlation with urinalysis recommended to exclude cystitis. 6. Aortic Atherosclerosis (ICD10-I70.0) and Emphysema (ICD10-J43.9). Electronically Signed   By: Elgie CollardArash  Radparvar M.D.   On: 20-Jun-2016 21:23   Ct Chest Wo Contrast  Result Date: 2016/08/11 CLINICAL DATA:  83 year old male with  rectal bleed and sepsis and abdominal pain. EXAM: CT CHEST, ABDOMEN AND PELVIS WITHOUT CONTRAST TECHNIQUE: Multidetector CT imaging of the chest, abdomen and pelvis was performed following the standard protocol without IV contrast. COMPARISON:  Chest radiograph dated 11-Sep-2016 and CT dated 11/06/2014 FINDINGS: Evaluation of this exam is limited in the absence of intravenous contrast. Evaluation is also limited due to respiratory motion artifact. CT CHEST FINDINGS Cardiovascular: There is mild cardiomegaly. No pericardial effusion. Mild coronary vascular calcification involving the left circumflex artery. There is mild atherosclerotic calcification of the thoracic aorta. No aneurysmal dilatation. Mild prominence of the central pulmonary arteries suggestive of underlying pulmonary hypertension. Evaluation of the vasculature is limited in the absence of intravenous contrast. Mediastinum/Nodes: There is a mildly enlarged lymph node in the prevascular space measuring 15 mm in short axis. Aortopulmonic window lymph node measures 16 mm in short axis. Confluent subcarinal lymph node or  adenopathy noted. Esophagus is grossly unremarkable as visualized. There is a 2 cm hypodense nodule in the right thyroid gland. Lungs/Pleura: There is severe centrilobular and paraseptal emphysema as well as areas of peribronchovascular and subpleural reticulation associated bronchiectasis consistent with interstitial lung disease. There are large patchy areas of airspace consolidation involving upper and lower lobes bilaterally, new compared to the prior CT of 2016 and concerning for superimposed infection. Correlation with clinical exam and follow-up after treatment recommended. There is probable small right pleural effusion versus pleural thickening. No pneumothorax. The central airways are patent. Musculoskeletal: There is degenerative changes of the spine. No acute osseous pathology. CT ABDOMEN PELVIS FINDINGS There is no intra-abdominal free air or free fluid. Hepatobiliary: There is diffuse fatty infiltration of the liver. No intrahepatic biliary ductal dilatation. Small layering sludge may be present within the gallbladder. No pericholecystic fluid. Pancreas: Unremarkable. No pancreatic ductal dilatation or surrounding inflammatory changes. Spleen: Normal in size without focal abnormality. Adrenals/Urinary Tract: The adrenal glands, kidneys, and the visualized ureters appear unremarkable. There is mild diffuse thickened and trabecular appearance of the bladder wall, likely related to chronic bladder outlet obstruction or chronic infection. Correlation with urinalysis recommended to exclude acute cystitis. Stomach/Bowel: There is no evidence of bowel obstruction or active inflammation. The appendix is not visualized with certainty. No inflammatory changes identified in the right lower quadrant. Vascular/Lymphatic: Advanced aortoiliac atherosclerotic disease. Evaluation of the aorta and IVC is limited in the absence of intravenous contrast. No portal venous gas identified. There is no adenopathy. Reproductive:  The prostate and seminal vesicles are grossly unremarkable. No pelvic mass. Other: Mild diffuse mesenteric stranding. There is a small calcified fat containing umbilical hernia, likely sequela of chronic inflammation or incarceration. No fluid collection. Musculoskeletal: There is osteopenia with degenerative changes of the spine. Grade 1 L4-L5 retrolisthesis. A 9 mm sclerotic focus in the L4 vertebra is indeterminate but may represent a bone island. There is mild L2 compression deformity, age indeterminate, likely chronic. No definite acute fracture. No retropulsed fragment. IMPRESSION: 1. Extensive consolidative changes of the lungs concerning for pneumonia. Clinical correlation and follow-up to resolution recommended. There is a background of emphysema and interstitial lung disease. 2. Mild cardiomegaly. 3. No acute intra-abdominal or pelvic pathology. No bowel obstruction or active inflammation. 4. Fatty liver. 5. Mildly thickened and trabeculated appearance of the bladder wall, likely related to chronic bladder outlet obstruction. Correlation with urinalysis recommended to exclude cystitis. 6. Aortic Atherosclerosis (ICD10-I70.0) and Emphysema (ICD10-J43.9). Electronically Signed   By: Elgie Collard M.D.   On: 2016/09/11 21:23   Dg Chest [facility]  1 View  Result Date: 20-Mar-2016 CLINICAL DATA:  Rectal bleeding today. EXAM: PORTABLE CHEST 1 VIEW COMPARISON:  09/26/2014 FINDINGS: There is extensive airspace disease throughout both mid and lower lung zones. Cardiomegaly. No pneumothorax. No pleural effusion IMPRESSION: Extensive bilateral airspace disease. Followup PA and lateral chest X-ray is recommended in 3-4 weeks following trial of antibiotic therapy to ensure resolution and exclude underlying malignancy. Electronically Signed   By: Jolaine ClickArthur  Hoss M.D.   On: 009-Feb-2018 20:29    Procedures Procedures (including critical care time)  Medications Ordered in ED Medications  piperacillin-tazobactam (ZOSYN)  IVPB 3.375 g (not administered)  sodium chloride 0.9 % bolus 1,000 mL (0 mLs Intravenous Stopped 08-23-2016 1906)  piperacillin-tazobactam (ZOSYN) IVPB 3.375 g (0 g Intravenous Stopped 08-23-2016 2130)  vancomycin (VANCOCIN) IVPB 1000 mg/200 mL premix (0 mg Intravenous Stopped 08-23-2016 2131)     Initial Impression / Assessment and Plan / ED Course  I have reviewed the triage vital signs and the nursing notes.  Pertinent labs & imaging results that were available during my care of the patient were reviewed by me and considered in my medical decision making (see chart for details).  After the initial evaluation with concern for sepsis the patient received broad-spectrum antibiotics, fluid resuscitation, and given his hypothermia was placed in a bear hugger device.   10:20 PM Lactic acid 5. I discussed the patient's case with our critical care team.  We agreed that with a diminished lactic acid, and the patient's stable blood pressure reading, he can be admitted to the stepdown unit at this facility, he will be aware of the patient. On repeat exam the patient remains in similar condition, though he requires a Ventimask at 15 L to maintain oxygen saturation of 90%. He is awake and alert. We discussed goals of care, the patient indicates that he would like all available efforts made.  This elderly male presents with family members due to nausea, vomiting, rectal bleeding. On initial impression the patient is grossly ill, with immediate concern for sepsis. Patient's initial lactic acidosis with a value of greater than 10 is consistent with this. Patient received immediate fluid resuscitation, and broad-spectrum antibiotics. Labs also notable for multisystem organ injury, with liver and renal dysfunction. X-ray and chest CT consistent with multifocal pneumonia, likely contributing to patient's critical illness. After the patient had resuscitation with fluids, broad-spectrum antibiotic, continuous  oxygen supplementation, and after discussion with our critical care colleagues, the patient was admitted to the stepdown unit for further evaluation and management.   Final Clinical Impressions(s) / ED Diagnoses  Sepsis Multisystem organ failure multifocal pneumonia rectal bleeding   CRITICAL CARE Performed by: Gerhard MunchLOCKWOOD, Demarius Archila Total critical care time: 40 minutes Critical care time was exclusive of separately billable procedures and treating other patients. Critical care was necessary to treat or prevent imminent or life-threatening deterioration. Critical care was time spent personally by me on the following activities: development of treatment plan with patient and/or surrogate as well as nursing, discussions with consultants, evaluation of patient's response to treatment, examination of patient, obtaining history from patient or surrogate, ordering and performing treatments and interventions, ordering and review of laboratory studies, ordering and review of radiographic studies, pulse oximetry and re-evaluation of patient's condition.    Gerhard MunchLockwood, Carlosdaniel Grob, MD [id number]

## 2016-09-08 NOTE — H&P (Signed)
History and Physical    Waller Marcussen ZOX:[id number] DOB: Aug 02, 1923 DOA: 09/17/16  PCP: Tobey Grim, MD   Patient coming from: Home.  I have personally briefly reviewed patient's old medical records in [facility]  Chief Complaint: Abdominal pain and blood in stool yesterday.  HPI: Lee Irwin is a 83 y.o. male from [place] with medical history significant of left eye anisocoria, osteoarthritis, cataracts, COPD, wheelchair-bound who is brought in by his family due to abdominal pain, hematochezia in the morning today, weakness, nausea, multiple episodes of emesis and multiple episodes of diarrhea for the past 2 days. Not sure if the patient has had fever, chills, but has been fatigued with decreased appetite. He denies through translator, headache, chest pain, abdominal pain, back pain at this time. He complains of shortness of breath despite having 100% Ventimask oxygen. CCM was initially consulted, but recommended stepdown admission since his lactic acid was improving substantially.     ED Course: The patient was given normal saline for a total of 2000 mL, vancomycin and Zosyn per pharmacy. Subsequently, I had to order BiPAP ventilation due to hypoxia in the low 80s despite Ventimask at 100%.   He was in and out of atrial fibrillation while in the emergency department. He subsequently had a transient episode of SVT at 180 BPM, which resolved with carotid massage. His atrial fibrillation broke with Cardizem 5 mg IVP. Critical care medicine was subsequently consulted.  Labs: WBC was 31, hemoglobin 12.4 g/dL and platelets 811. His potassium is 2.9, bicarbonate 12 and lactic acid 10.68 millimoles/L. BUN was 35 and creatinine 2.05 mg/dL. Albumin was 2.4 g/dL. The patient showed evidence of transaminitis. Please see full lab reports are further detail.  Imaging: Chest radiograph shows extensive air space disease throughout both mid and lower lung zones. CT chest without  contrast showed extensive consolidative changes of the lungs concerning for pneumonia. There is background emphysema and interstitial lung disease. CT abdomen/pelvis showed aortic atherosclerosis and fatty liver among other findings. Please see images and full radiology report for further detail  Review of Systems: As per HPI otherwise 10 point review of systems negative.    Past Medical History:  Diagnosis Date  . 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  . Arthritis   . Cataract   . COPD (chronic obstructive pulmonary disease) (HCC)   . Wheelchair bound     History reviewed. No pertinent surgical history.   reports that he quit smoking about 13 years ago. He has never used smokeless tobacco. He reports that he does not drink alcohol. His drug history is not on file.  No Known Allergies  Family history Unable to obtain due to the patient's sickness acuity and language barrier.  Prior to Admission medications   Medication Sig Start Date End Date Taking? Authorizing Provider  gabapentin (NEURONTIN) 300 MG capsule Take 1 capsule (300 mg total) by mouth 3 (three) times daily. 06/18/16  Yes Jeneen Rinks J, DO  ibuprofen (ADVIL,MOTRIN) 200 MG tablet Take 200 mg by mouth every 6 (six) hours as needed.   Yes [provider]  ipratropium (ATROVENT) 0.03 % nasal spray Place 2 sprays into both nostrils every 12 (twelve) hours. 06/18/16  Yes Jeneen Rinks J, DO  ondansetron (ZOFRAN) 4 MG tablet Take 1 tablet (4 mg total) by mouth every 8 (eight) hours as needed for nausea or vomiting. 09/04/16  Yes Mikell, Antionette Poles, MD  Aspirin (ECOTRIN PO) Take  81 mg by mouth 3 (three) times daily after meals. Reported on 03/26/2015    [provider]    Physical Exam: Vitals:   09/02/2016 2100 08/22/2016 2130 08/15/2016 2132 08/23/2016 2200  BP: (!) 107/53  (!) 113/56   Pulse: 91 87 95 90  Resp:  (!) 23 17 (!) 27  Temp:      TempSrc:      SpO2: (!) 87% 95% 91%  91%  Weight:      Height:        Constitutional: NAD, calm, comfortable Eyes: Anisocoria, lids and conjunctivae normal ENMT: Mucous membranes are dry. Posterior pharynx clear of any exudate or lesions. poor state of repair of dentition. Neck: normal, supple, no masses, no thyromegaly Respiratory: Decreased breath sounds bilaterally with wheezing and rhonchi. Cardiovascular: Irregularly irregular, tachycardic at 104 BPM, no murmurs / rubs / gallops. No extremity edema. 2+ pedal pulses. No carotid bruits.  Abdomen: Soft, no tenderness, no masses palpated. No hepatosplenomegaly. Bowel sounds positive.  Musculoskeletal: no clubbing / cyanosis. Lower extremities atrophy with decreased ROM, no contractures. Normal muscle tone.  Skin: Multiple small abrasions on lower extremities. (Family mentions that the patient sometimes drags his legs on the floor, while using his upper extremities to move around). Neurologic: CN 2-12 grossly intact. Sensation intact, DTR normal. Strength 5/5 in all 4.  Psychiatric: Alert, oriented 2 initially. Subsequently sedated with Ativan.   Labs on Admission: I have personally reviewed following labs and imaging studies  CBC:  Recent Labs Lab 09/04/16 1232 09/05/2016 1810  WBC 12.2* 30.8*  NEUTROABS 8.7*  --   HGB 11.3* 12.4*  HCT 35.8* 38.1*  MCV 90 91.1  PLT 214 129*   Basic Metabolic Panel:  Recent Labs Lab 08/13/2016 1810  NA 135  K 2.9*  CL 99*  CO2 12*  GLUCOSE 84  BUN 35*  CREATININE 2.05*  CALCIUM 7.8*   GFR: Estimated Creatinine Clearance: 21.8 mL/min (A) (by C-G formula based on SCr of 2.05 mg/dL (H)). Liver Function Tests:  Recent Labs Lab 09/02/2016 1810  AST 447*  ALT 126*  ALKPHOS 97  BILITOT 2.5*  PROT 7.1  ALBUMIN 2.4*   No results for input(s): LIPASE, AMYLASE in the last 168 hours. No results for input(s): AMMONIA in the last 168 hours. Coagulation Profile: No results for input(s): INR, PROTIME in the last 168  hours. Cardiac Enzymes: No results for input(s): CKTOTAL, CKMB, CKMBINDEX, TROPONINI in the last 168 hours. BNP (last 3 results) No results for input(s): PROBNP in the last 8760 hours. HbA1C: No results for input(s): HGBA1C in the last 72 hours. CBG: No results for input(s): GLUCAP in the last 168 hours. Lipid Profile: No results for input(s): CHOL, HDL, LDLCALC, TRIG, CHOLHDL, LDLDIRECT in the last 72 hours. Thyroid Function Tests: No results for input(s): TSH, T4TOTAL, FREET4, T3FREE, THYROIDAB in the last 72 hours. Anemia Panel: No results for input(s): VITAMINB12, FOLATE, FERRITIN, TIBC, IRON, RETICCTPCT in the last 72 hours. Urine analysis:    Component Value Date/Time   COLORURINE YELLOW 08/13/2016 1844   APPEARANCEUR CLEAR 09/06/2016 1844   LABSPEC 1.011 09/07/2016 1844   PHURINE 5.0 08/16/2016 1844   GLUCOSEU 50 (A) 08/21/2016 1844   HGBUR MODERATE (A) 08/24/2016 1844   BILIRUBINUR NEGATIVE 08/30/2016 1844   BILIRUBINUR NEG 09/28/2014 1100   KETONESUR 20 (A) 08/31/2016 1844   PROTEINUR 30 (A) 08/30/2016 1844   UROBILINOGEN 0.2 09/28/2014 1100   NITRITE NEGATIVE 08/26/2016 1844   LEUKOCYTESUR NEGATIVE 08/18/2016  1844    Radiological Exams on Admission: Ct Abdomen Pelvis Wo Contrast  Result Date: 08/27/2016 CLINICAL DATA:  83 year old male with rectal bleed and sepsis and abdominal pain. EXAM: CT CHEST, ABDOMEN AND PELVIS WITHOUT CONTRAST TECHNIQUE: Multidetector CT imaging of the chest, abdomen and pelvis was performed following the standard protocol without IV contrast. COMPARISON:  Chest radiograph dated 09/03/2016 and CT dated 11/06/2014 FINDINGS: Evaluation of this exam is limited in the absence of intravenous contrast. Evaluation is also limited due to respiratory motion artifact. CT CHEST FINDINGS Cardiovascular: There is mild cardiomegaly. No pericardial effusion. Mild coronary vascular calcification involving the left circumflex artery. There is mild atherosclerotic  calcification of the thoracic aorta. No aneurysmal dilatation. Mild prominence of the central pulmonary arteries suggestive of underlying pulmonary hypertension. Evaluation of the vasculature is limited in the absence of intravenous contrast. Mediastinum/Nodes: There is a mildly enlarged lymph node in the prevascular space measuring 15 mm in short axis. Aortopulmonic window lymph node measures 16 mm in short axis. Confluent subcarinal lymph node or adenopathy noted. Esophagus is grossly unremarkable as visualized. There is a 2 cm hypodense nodule in the right thyroid gland. Lungs/Pleura: There is severe centrilobular and paraseptal emphysema as well as areas of peribronchovascular and subpleural reticulation associated bronchiectasis consistent with interstitial lung disease. There are large patchy areas of airspace consolidation involving upper and lower lobes bilaterally, new compared to the prior CT of 2016 and concerning for superimposed infection. Correlation with clinical exam and follow-up after treatment recommended. There is probable small right pleural effusion versus pleural thickening. No pneumothorax. The central airways are patent. Musculoskeletal: There is degenerative changes of the spine. No acute osseous pathology. CT ABDOMEN PELVIS FINDINGS There is no intra-abdominal free air or free fluid. Hepatobiliary: There is diffuse fatty infiltration of the liver. No intrahepatic biliary ductal dilatation. Small layering sludge may be present within the gallbladder. No pericholecystic fluid. Pancreas: Unremarkable. No pancreatic ductal dilatation or surrounding inflammatory changes. Spleen: Normal in size without focal abnormality. Adrenals/Urinary Tract: The adrenal glands, kidneys, and the visualized ureters appear unremarkable. There is mild diffuse thickened and trabecular appearance of the bladder wall, likely related to chronic bladder outlet obstruction or chronic infection. Correlation with  urinalysis recommended to exclude acute cystitis. Stomach/Bowel: There is no evidence of bowel obstruction or active inflammation. The appendix is not visualized with certainty. No inflammatory changes identified in the right lower quadrant. Vascular/Lymphatic: Advanced aortoiliac atherosclerotic disease. Evaluation of the aorta and IVC is limited in the absence of intravenous contrast. No portal venous gas identified. There is no adenopathy. Reproductive: The prostate and seminal vesicles are grossly unremarkable. No pelvic mass. Other: Mild diffuse mesenteric stranding. There is a small calcified fat containing umbilical hernia, likely sequela of chronic inflammation or incarceration. No fluid collection. Musculoskeletal: There is osteopenia with degenerative changes of the spine. Grade 1 L4-L5 retrolisthesis. A 9 mm sclerotic focus in the L4 vertebra is indeterminate but may represent a bone island. There is mild L2 compression deformity, age indeterminate, likely chronic. No definite acute fracture. No retropulsed fragment. IMPRESSION: 1. Extensive consolidative changes of the lungs concerning for pneumonia. Clinical correlation and follow-up to resolution recommended. There is a background of emphysema and interstitial lung disease. 2. Mild cardiomegaly. 3. No acute intra-abdominal or pelvic pathology. No bowel obstruction or active inflammation. 4. Fatty liver. 5. Mildly thickened and trabeculated appearance of the bladder wall, likely related to chronic bladder outlet obstruction. Correlation with urinalysis recommended to exclude cystitis. 6. Aortic Atherosclerosis (  ICD10-I70.0) and Emphysema (ICD10-J43.9). Electronically Signed   By: Elgie CollardArash  Radparvar M.D.   On: 08/30/2016 21:23   Ct Chest Wo Contrast  Result Date: 09/04/2016 CLINICAL DATA:  83 year old male with rectal bleed and sepsis and abdominal pain. EXAM: CT CHEST, ABDOMEN AND PELVIS WITHOUT CONTRAST TECHNIQUE: Multidetector CT imaging of the  chest, abdomen and pelvis was performed following the standard protocol without IV contrast. COMPARISON:  Chest radiograph dated 08/24/2016 and CT dated 11/06/2014 FINDINGS: Evaluation of this exam is limited in the absence of intravenous contrast. Evaluation is also limited due to respiratory motion artifact. CT CHEST FINDINGS Cardiovascular: There is mild cardiomegaly. No pericardial effusion. Mild coronary vascular calcification involving the left circumflex artery. There is mild atherosclerotic calcification of the thoracic aorta. No aneurysmal dilatation. Mild prominence of the central pulmonary arteries suggestive of underlying pulmonary hypertension. Evaluation of the vasculature is limited in the absence of intravenous contrast. Mediastinum/Nodes: There is a mildly enlarged lymph node in the prevascular space measuring 15 mm in short axis. Aortopulmonic window lymph node measures 16 mm in short axis. Confluent subcarinal lymph node or adenopathy noted. Esophagus is grossly unremarkable as visualized. There is a 2 cm hypodense nodule in the right thyroid gland. Lungs/Pleura: There is severe centrilobular and paraseptal emphysema as well as areas of peribronchovascular and subpleural reticulation associated bronchiectasis consistent with interstitial lung disease. There are large patchy areas of airspace consolidation involving upper and lower lobes bilaterally, new compared to the prior CT of 2016 and concerning for superimposed infection. Correlation with clinical exam and follow-up after treatment recommended. There is probable small right pleural effusion versus pleural thickening. No pneumothorax. The central airways are patent. Musculoskeletal: There is degenerative changes of the spine. No acute osseous pathology. CT ABDOMEN PELVIS FINDINGS There is no intra-abdominal free air or free fluid. Hepatobiliary: There is diffuse fatty infiltration of the liver. No intrahepatic biliary ductal dilatation. Small  layering sludge may be present within the gallbladder. No pericholecystic fluid. Pancreas: Unremarkable. No pancreatic ductal dilatation or surrounding inflammatory changes. Spleen: Normal in size without focal abnormality. Adrenals/Urinary Tract: The adrenal glands, kidneys, and the visualized ureters appear unremarkable. There is mild diffuse thickened and trabecular appearance of the bladder wall, likely related to chronic bladder outlet obstruction or chronic infection. Correlation with urinalysis recommended to exclude acute cystitis. Stomach/Bowel: There is no evidence of bowel obstruction or active inflammation. The appendix is not visualized with certainty. No inflammatory changes identified in the right lower quadrant. Vascular/Lymphatic: Advanced aortoiliac atherosclerotic disease. Evaluation of the aorta and IVC is limited in the absence of intravenous contrast. No portal venous gas identified. There is no adenopathy. Reproductive: The prostate and seminal vesicles are grossly unremarkable. No pelvic mass. Other: Mild diffuse mesenteric stranding. There is a small calcified fat containing umbilical hernia, likely sequela of chronic inflammation or incarceration. No fluid collection. Musculoskeletal: There is osteopenia with degenerative changes of the spine. Grade 1 L4-L5 retrolisthesis. A 9 mm sclerotic focus in the L4 vertebra is indeterminate but may represent a bone island. There is mild L2 compression deformity, age indeterminate, likely chronic. No definite acute fracture. No retropulsed fragment. IMPRESSION: 1. Extensive consolidative changes of the lungs concerning for pneumonia. Clinical correlation and follow-up to resolution recommended. There is a background of emphysema and interstitial lung disease. 2. Mild cardiomegaly. 3. No acute intra-abdominal or pelvic pathology. No bowel obstruction or active inflammation. 4. Fatty liver. 5. Mildly thickened and trabeculated appearance of the bladder  wall, likely related to chronic bladder  outlet obstruction. Correlation with urinalysis recommended to exclude cystitis. 6. Aortic Atherosclerosis (ICD10-I70.0) and Emphysema (ICD10-J43.9). Electronically Signed   By: Elgie Collard M.D.   On: 09-15-2016 21:23   Dg Chest Port 1 View  Result Date: 09/15/2016 CLINICAL DATA:  Rectal bleeding today. EXAM: PORTABLE CHEST 1 VIEW COMPARISON:  09/26/2014 FINDINGS: There is extensive airspace disease throughout both mid and lower lung zones. Cardiomegaly. No pneumothorax. No pleural effusion IMPRESSION: Extensive bilateral airspace disease. Followup PA and lateral chest X-ray is recommended in 3-4 weeks following trial of antibiotic therapy to ensure resolution and exclude underlying malignancy. Electronically Signed   By: Jolaine Click M.D.   On: 09/15/16 20:29    EKG: Independently reviewed. Vent. rate 89 BPM PR interval * ms QRS duration 129 ms QT/QTc 438/533 ms P-R-T axes 28 55 45 Sinus rhythm Atrial premature complex Probable left atrial enlargement Right bundle branch block Abnormal ekg  Assessment/Plan Principal Problem:   Sepsis due to pneumonia (HCC) Initially treated aggressively without significant response from the patient. Critical care medicine was consulted and after discussing extensively with the family clinical situation, risks and benefits of proceeding years, the family has decided to make the patient DNR/DNI.  Active Problems:   GERD (gastroesophageal reflux disease)   History of CVA (cerebrovascular accident)   AKI (acute kidney injury) (HCC)   Hypokalemia   Abnormal LFTs   Anemia  Despite significant efforts on our part, the patient clinical status continued to deteriorate. He has multiorgan involvement, responded initially to treatment, but subsequently has deteriorated. Critical care medicine was consulted and the patient's family declined further aggressive measures or treatment. He has been made DO NOT  RESUSCITATE/DO NOT INTUBATE status. Palliative care consult and morphine infusion has been ordered by CCM. I have discontinued IV antibiotics, lab draws or any other non-comfort measure.     DVT prophylaxis: Comfort measures. Code Status: DO NOT RESUSCITATE/Comfort Care. Family Communication: Her grandson and granddaughter were present in the emergency department Disposition Plan: Comfort care measures and palliative care medicine. Consults called: Critical care medicine. Admission status: Inpatient/stepdown.   Bobette Mo MD [facility] Pager [phone].  If 7PM-7AM, please contact night-coverage [email or web address] Password [email or web address]  2016-09-15, 11:03 PM

## 2016-09-08 NOTE — ED Notes (Signed)
EDP at bedside  

## 2016-09-08 NOTE — Progress Notes (Signed)
Pharmacy Antibiotic Note  Lee Irwin is a 83 y.o. male presented the ED on 2016/06/07 with c/o abd pain and rectal bleeding.  To start broad abx with vancomycin and zosyn for suspected sepsis.  - wbc 30.8, scr 2.05 (crcl~22 ), LA 10.68   Plan: - Zosyn 3.375 gm IV x1 over 30 min, then 3.375 gm IV q8h (infuse over 4 hours) - Vancomycin 1000 mg IV x 1.  Will f/u renal function in AM and re-dose based on trend for scr  - daily scr  ________________________________    Temp (24hrs), Avg:95.4 F (35.2 C), Min:95.4 F (35.2 C), Max:95.4 F (35.2 C)   Recent Labs Lab 09/04/16 1232 24-Apr-2016 1810 24-Apr-2016 1836  WBC 12.2* 30.8*  --   LATICACIDVEN  --   --  10.68*    CrCl cannot be calculated (Patient's most recent lab result is older than the maximum 21 days allowed.).    No Known Allergies   Thank you for allowing pharmacy to be a part of this patient's care.  Lee Irwin, Lee Irwin 2016/06/07 7:07 PM

## 2016-09-08 NOTE — ED Notes (Signed)
Bright red blood stool noted when taking his pants off.

## 2016-09-08 NOTE — ED Notes (Signed)
Bed: WA12 Expected date:  Expected time:  Means of arrival:  Comments: Hall B 

## 2016-09-08 NOTE — ED Notes (Signed)
POC occult blood cancelled, Lee Irwin EDP aware that pt is having active bleeding.

## 2016-09-08 NOTE — ED Notes (Signed)
Pt granddaughter reports bright red rectal bleeding today.  States he was seen at the [facility] last week for fever and nausea and vomiting.  [place], [place]

## 2016-09-08 NOTE — ED Triage Notes (Signed)
Per EMS, pt from home, pt's family reports abd pain and blood in stool yesterday.  Family also reports n/v.  CBG-56 en route, was given 12.5 mg of D50 and zofran 4mg  PO.

## 2016-09-09 LAB — COMPREHENSIVE METABOLIC PANEL
ALBUMIN: 2.1 g/dL — AB (ref 3.5–5.0)
ALT: 113 U/L — ABNORMAL HIGH (ref 17–63)
ANION GAP: 12 (ref 5–15)
AST: 329 U/L — ABNORMAL HIGH (ref 15–41)
Alkaline Phosphatase: 205 U/L — ABNORMAL HIGH (ref 38–126)
BILIRUBIN TOTAL: 2.4 mg/dL — AB (ref 0.3–1.2)
BUN: 33 mg/dL — ABNORMAL HIGH (ref 6–20)
CO2: 18 mmol/L — ABNORMAL LOW (ref 22–32)
Calcium: 7.2 mg/dL — ABNORMAL LOW (ref 8.9–10.3)
Chloride: 108 mmol/L (ref 101–111)
Creatinine, Ser: 1.87 mg/dL — ABNORMAL HIGH (ref 0.61–1.24)
GFR calc Af Amer: 35 mL/min — ABNORMAL LOW (ref >60)
GFR, EST NON AFRICAN AMERICAN: 30 mL/min — AB (ref >60)
Glucose, Bld: 34 mg/dL — CL (ref 65–99)
POTASSIUM: 3.2 mmol/L — AB (ref 3.5–5.1)
Sodium: 138 mmol/L (ref 135–145)
TOTAL PROTEIN: 6.4 g/dL — AB (ref 6.5–8.1)

## 2016-09-09 LAB — CBC WITH DIFFERENTIAL/PLATELET
Basophils Absolute: 0 10*3/uL (ref 0.0–0.1)
Basophils Relative: 0 %
EOS ABS: 0 10*3/uL (ref 0.0–0.7)
EOS PCT: 0 %
HEMATOCRIT: 43.6 % (ref 39.0–52.0)
Hemoglobin: 13.9 g/dL (ref 13.0–17.0)
LYMPHS ABS: 1.1 10*3/uL (ref 0.7–4.0)
Lymphocytes Relative: 3 %
MCH: 29.6 pg (ref 26.0–34.0)
MCHC: 31.9 g/dL (ref 30.0–36.0)
MCV: 92.8 fL (ref 78.0–100.0)
MONO ABS: 0 10*3/uL — AB (ref 0.1–1.0)
Monocytes Relative: 0 %
Neutro Abs: 36.2 10*3/uL — ABNORMAL HIGH (ref 1.7–7.7)
Neutrophils Relative %: 97 %
PLATELETS: 119 10*3/uL — AB (ref 150–400)
RBC: 4.7 MIL/uL (ref 4.22–5.81)
RDW: 18.6 % — AB (ref 11.5–15.5)
WBC: 37.3 10*3/uL — AB (ref 4.0–10.5)

## 2016-09-09 LAB — APTT: APTT: 65 s — AB (ref 24–36)

## 2016-09-09 LAB — PROTIME-INR
INR: 2.8
PROTHROMBIN TIME: 30.1 s — AB (ref 11.4–15.2)

## 2016-09-09 LAB — LACTIC ACID, PLASMA
LACTIC ACID, VENOUS: 4.8 mmol/L — AB (ref 0.5–1.9)
LACTIC ACID, VENOUS: 6.1 mmol/L — AB (ref 0.5–1.9)

## 2016-09-09 LAB — MRSA PCR SCREENING: MRSA BY PCR: NEGATIVE

## 2016-09-09 LAB — MAGNESIUM
MAGNESIUM: 1.8 mg/dL (ref 1.7–2.4)
MAGNESIUM: 2.9 mg/dL — AB (ref 1.7–2.4)

## 2016-09-09 LAB — PHOSPHORUS: PHOSPHORUS: 4.2 mg/dL (ref 2.5–4.6)

## 2016-09-09 LAB — ABO/RH: ABO/RH(D): O POS

## 2016-09-09 MED ORDER — GLYCOPYRROLATE 1 MG PO TABS
1.0000 mg | ORAL_TABLET | ORAL | Status: DC | PRN
  Filled 2016-09-09: qty 1

## 2016-09-09 MED ORDER — LORAZEPAM 2 MG/ML IJ SOLN
0.5000 mg | INTRAMUSCULAR | Status: DC | PRN
  Filled 2016-09-09 (×2): qty 1

## 2016-09-09 MED ORDER — DILTIAZEM HCL 25 MG/5ML IV SOLN
5.0000 mg | Freq: Once | INTRAVENOUS | Status: AC
  Administered 2016-09-09: 5 mg via INTRAVENOUS
  Filled 2016-09-09: qty 5

## 2016-09-09 MED ORDER — HEPARIN SODIUM (PORCINE) 5000 UNIT/ML IJ SOLN
5000.0000 [IU] | Freq: Three times a day (TID) | INTRAMUSCULAR | Status: DC
  Administered 2016-09-09: 5000 [IU] via SUBCUTANEOUS
  Filled 2016-09-09: qty 1

## 2016-09-09 MED ORDER — GLYCOPYRROLATE 0.2 MG/ML IJ SOLN
0.2000 mg | INTRAMUSCULAR | Status: DC | PRN
  Filled 2016-09-09: qty 1

## 2016-09-09 MED ORDER — SODIUM CHLORIDE 0.9% FLUSH: 9.0000 mL | INTRAVENOUS | Status: DC | PRN

## 2016-09-09 MED ORDER — DIPHENHYDRAMINE HCL 12.5 MG/5ML PO ELIX: 12.5000 mg | ORAL_SOLUTION | Freq: Four times a day (QID) | ORAL | Status: DC | PRN

## 2016-09-09 MED ORDER — BIOTENE DRY MOUTH MT LIQD: 15.0000 mL | OROMUCOSAL | Status: DC | PRN

## 2016-09-09 MED ORDER — IPRATROPIUM-ALBUTEROL 0.5-2.5 (3) MG/3ML IN SOLN
3.0000 mL | Freq: Four times a day (QID) | RESPIRATORY_TRACT | Status: DC
  Administered 2016-09-09: 3 mL via RESPIRATORY_TRACT

## 2016-09-09 MED ORDER — IPRATROPIUM-ALBUTEROL 0.5-2.5 (3) MG/3ML IN SOLN
RESPIRATORY_TRACT | Status: AC
  Filled 2016-09-09: qty 3

## 2016-09-09 MED ORDER — SODIUM CHLORIDE 0.9 % IV BOLUS (SEPSIS): 500.0000 mL | Freq: Once | INTRAVENOUS | Status: DC

## 2016-09-09 MED ORDER — ONDANSETRON HCL 4 MG/2ML IJ SOLN: 4.0000 mg | Freq: Four times a day (QID) | INTRAMUSCULAR | Status: DC | PRN

## 2016-09-09 MED ORDER — NALOXONE HCL 0.4 MG/ML IJ SOLN: 0.4000 mg | INTRAMUSCULAR | Status: DC | PRN

## 2016-09-09 MED ORDER — ACETAMINOPHEN 325 MG PO TABS: 650.0000 mg | ORAL_TABLET | Freq: Four times a day (QID) | ORAL | Status: DC | PRN

## 2016-09-09 MED ORDER — DIPHENHYDRAMINE HCL 50 MG/ML IJ SOLN: 12.5000 mg | Freq: Four times a day (QID) | INTRAMUSCULAR | Status: DC | PRN

## 2016-09-09 MED ORDER — MAGNESIUM SULFATE 2 GM/50ML IV SOLN
2.0000 g | Freq: Once | INTRAVENOUS | Status: AC
  Administered 2016-09-09: 2 g via INTRAVENOUS
  Filled 2016-09-09: qty 50

## 2016-09-09 MED ORDER — POLYVINYL ALCOHOL 1.4 % OP SOLN
1.0000 [drp] | Freq: Four times a day (QID) | OPHTHALMIC | Status: DC | PRN
  Filled 2016-09-09: qty 15

## 2016-09-09 MED ORDER — SODIUM CHLORIDE 0.9 % IV SOLN
1.0000 mg/h | INTRAVENOUS | Status: DC
  Administered 2016-09-09: 1 mg/h via INTRAVENOUS
  Filled 2016-09-09: qty 10

## 2016-09-09 MED ORDER — POTASSIUM CHLORIDE IN NACL 40-0.9 MEQ/L-% IV SOLN
INTRAVENOUS | Status: DC
  Administered 2016-09-09: 100 mL/h via INTRAVENOUS
  Filled 2016-09-09: qty 1000

## 2016-09-09 MED ORDER — SODIUM CHLORIDE 0.9 % IV BOLUS (SEPSIS)
500.0000 mL | Freq: Once | INTRAVENOUS | Status: AC
  Administered 2016-09-09: 500 mL via INTRAVENOUS

## 2016-09-09 MED ORDER — ACETAMINOPHEN 650 MG RE SUPP: 650.0000 mg | Freq: Four times a day (QID) | RECTAL | Status: DC | PRN

## 2016-09-09 MED ORDER — POTASSIUM CHLORIDE 10 MEQ/100ML IV SOLN
10.0000 meq | INTRAVENOUS | Status: AC
  Administered 2016-09-09: 10 meq via INTRAVENOUS
  Filled 2016-09-09 (×2): qty 100

## 2016-09-09 DEATH — deceased

## 2016-09-11 LAB — CULTURE, BLOOD (ROUTINE X 2): Special Requests: ADEQUATE

## 2016-09-13 LAB — CULTURE, BLOOD (ROUTINE X 2)
Culture: NO GROWTH
SPECIAL REQUESTS: ADEQUATE

## 2016-09-22 ENCOUNTER — Telehealth: Payer: Self-pay | Admitting: Family Medicine

## 2016-09-22 NOTE — Telephone Encounter (Signed)
I did know.  Thank you.  I did NOT know that the patient was living in a nursing home.  When I last saw him, he was living at home with family.

## 2016-09-22 NOTE — Telephone Encounter (Signed)
The nursing home that the patient was in, just wanted to make sure that the doctor knew that he passed away on March 25, 2016. jw

## 2016-10-10 NOTE — Progress Notes (Signed)
Nutrition Brief Note  Pt identified as at nutrition risk on the Malnutrition Screen Tool.  Chart reviewed. Pt now transitioning to comfort care.  No nutrition interventions warranted at this time.    Verda Mehta, MS, RD, LDN Pager: [phone] After Hours Pager: [phone]   

## 2016-10-10 NOTE — ED Notes (Addendum)
Date and time results received: 07/01/2016 T  Test: Lactic Acid Critical Value: 4.8  Name of Provider Notified: Robb Matarrtiz, MD  Orders Received? Or Actions Taken?: Orders Received - See Orders for details

## 2016-10-10 NOTE — Consult Note (Signed)
..   Name: Lee Irwin MRN: [id number] DOB: 02/27/1923    ADMISSION DATE:  10-30-2016 CONSULTATION DATE:10-30-2016  REFERRING MD :  Dr. Robb MatarTIZ (Hospitalist) CHIEF COMPLAINT:  Abdominal Pain,  HPI:  83 yo Nepali Male [facility] complaints of abdominal pain, rectal bleeding. Pt was recently seen and discharged home had some improvement but within last 2 days has not been able to eat much food and has complained of abdominal pain.  On presentation his vitals triggered Code Sepsis. Pt received 30cc/kg broad spectrum antibiotics.  Found to have significant lactic acidosis which post Rx trended down however patient's breathing became labored and required NIPPV which only achieved Sat 88% History accquired from Family ( granddaughter, grandson) chart and Education administratornursing staff.  SIGNIFICANT EVENTS  Acute hypoxic respiratory failure Sat 88% despite NIPPV Minimally responsive only grimacing to pain  STUDIES:  XR CT CHEST ABD/PELV BLOOD CX X2 SPUTUM CX   PAST MEDICAL HISTORY :   has a past medical history of Anisocoria; Arthritis; Cataract; COPD (chronic obstructive pulmonary disease) (HCC); and Wheelchair bound.  has no past surgical history on file. Prior to Admission medications   Medication Sig Start Date End Date Taking? Authorizing Provider  gabapentin (NEURONTIN) 300 MG capsule Take 1 capsule (300 mg total) by mouth 3 (three) times daily. 06/18/16  Yes Jeneen RinksYoo, Elsia J, DO  ibuprofen (ADVIL,MOTRIN) 200 MG tablet Take 200 mg by mouth every 6 (six) hours as needed.   Yes [provider]  ipratropium (ATROVENT) 0.03 % nasal spray Place 2 sprays into both nostrils every 12 (twelve) hours. 06/18/16  Yes Jeneen RinksYoo, Elsia J, DO  ondansetron (ZOFRAN) 4 MG tablet Take 1 tablet (4 mg total) by mouth every 8 (eight) hours as needed for nausea or vomiting. 09/04/16  Yes Mikell, Antionette PolesAsiyah Zahra, MD  Aspirin (ECOTRIN PO) Take 81 mg by mouth 3 (three) times daily after meals. Reported  on 03/26/2015    [provider]   No Known Allergies  FAMILY HISTORY:  family history is not on file. SOCIAL HISTORY:  reports that he quit smoking about 13 years ago. He has never used smokeless tobacco. He reports that he does not drink alcohol.  REVIEW OF SYSTEMS:   Constitutional: Negative for fever, chills, weight loss, malaise/fatigue and diaphoresis.  HENT: Negative for hearing loss, ear pain, nosebleeds, congestion, sore throat, neck pain, tinnitus and ear discharge.   Eyes: Negative for blurred vision, double vision, photophobia, pain, discharge and redness.  Respiratory: Negative for cough, hemoptysis, sputum production, shortness of breath, wheezing and stridor.   Cardiovascular: Negative for chest pain, palpitations, orthopnea, claudication, leg swelling and PND.  Gastrointestinal: Negative for heartburn, nausea, vomiting, abdominal pain, diarrhea, constipation, blood in stool and melena.  Genitourinary: Negative for dysuria, urgency, frequency, hematuria and flank pain.  Musculoskeletal: Negative for myalgias, back pain, joint pain and falls.  Skin: Negative for itching and rash.  Neurological: Negative for dizziness, tingling, tremors, sensory change, speech change, focal weakness, seizures, loss of consciousness, weakness and headaches.  Endo/Heme/Allergies: Negative for environmental allergies and polydipsia. Does not bruise/bleed easily.  SUBJECTIVE:   VITAL SIGNS: Temp:  [95.4 F (35.2 C)] 95.4 F (35.2 C) (07/31 1819) Pulse Rate:  [32-158] 109 (08/01 0300) Resp:  [16-30] 29 (08/01 0300) BP: (90-142)/(52-83) 132/76 (08/01 0300) SpO2:  [61 %-97 %] 89 % (08/01 0300) Weight:  [65.8 kg (145 lb)] 65.8 kg (145 lb) (07/31 1940)  PHYSICAL EXAMINATION: Marland Kitchen.Marland Kitchen.Physical Exam  Constitutional:  cachetic  HENT:  Head: Normocephalic.  Temporal wasting  Eyes: EOM are normal.  Cardiovascular:  irreg heart rate with some infreq PVCs  Pulmonary/Chest: He is in  respiratory distress.  Increased work of breathing with accessory muscles  Abdominal: Soft. There is tenderness. There is no rebound and no guarding.  Genitourinary: Rectal exam shows guaiac positive stool.  Musculoskeletal:  Partially contracted  Neurological:  grimmaces to pain not alert not verbal  Skin: Skin is dry. Capillary refill takes more than 3 seconds.    Recent Labs Lab October 05, 2016 1810  NA 135  K 2.9*  CL 99*  CO2 12*  BUN 35*  CREATININE 2.05*  GLUCOSE 84    Recent Labs Lab 09/04/16 1232 Oct 05, 2016 1810  HGB 11.3* 12.4*  HCT 35.8* 38.1*  WBC 12.2* 30.8*  PLT 214 129*   Ct Abdomen Pelvis Wo Contrast  Result Date: October 05, 2016 CLINICAL DATA:  83 year old male with rectal bleed and sepsis and abdominal pain. EXAM: CT CHEST, ABDOMEN AND PELVIS WITHOUT CONTRAST TECHNIQUE: Multidetector CT imaging of the chest, abdomen and pelvis was performed following the standard protocol without IV contrast. COMPARISON:  Chest radiograph dated 2016-10-05 and CT dated 11/06/2014 FINDINGS: Evaluation of this exam is limited in the absence of intravenous contrast. Evaluation is also limited due to respiratory motion artifact. CT CHEST FINDINGS Cardiovascular: There is mild cardiomegaly. No pericardial effusion. Mild coronary vascular calcification involving the left circumflex artery. There is mild atherosclerotic calcification of the thoracic aorta. No aneurysmal dilatation. Mild prominence of the central pulmonary arteries suggestive of underlying pulmonary hypertension. Evaluation of the vasculature is limited in the absence of intravenous contrast. Mediastinum/Nodes: There is a mildly enlarged lymph node in the prevascular space measuring 15 mm in short axis. Aortopulmonic window lymph node measures 16 mm in short axis. Confluent subcarinal lymph node or adenopathy noted. Esophagus is grossly unremarkable as visualized. There is a 2 cm hypodense nodule in the right thyroid gland. Lungs/Pleura:  There is severe centrilobular and paraseptal emphysema as well as areas of peribronchovascular and subpleural reticulation associated bronchiectasis consistent with interstitial lung disease. There are large patchy areas of airspace consolidation involving upper and lower lobes bilaterally, new compared to the prior CT of 2016 and concerning for superimposed infection. Correlation with clinical exam and follow-up after treatment recommended. There is probable small right pleural effusion versus pleural thickening. No pneumothorax. The central airways are patent. Musculoskeletal: There is degenerative changes of the spine. No acute osseous pathology. CT ABDOMEN PELVIS FINDINGS There is no intra-abdominal free air or free fluid. Hepatobiliary: There is diffuse fatty infiltration of the liver. No intrahepatic biliary ductal dilatation. Small layering sludge may be present within the gallbladder. No pericholecystic fluid. Pancreas: Unremarkable. No pancreatic ductal dilatation or surrounding inflammatory changes. Spleen: Normal in size without focal abnormality. Adrenals/Urinary Tract: The adrenal glands, kidneys, and the visualized ureters appear unremarkable. There is mild diffuse thickened and trabecular appearance of the bladder wall, likely related to chronic bladder outlet obstruction or chronic infection. Correlation with urinalysis recommended to exclude acute cystitis. Stomach/Bowel: There is no evidence of bowel obstruction or active inflammation. The appendix is not visualized with certainty. No inflammatory changes identified in the right lower quadrant. Vascular/Lymphatic: Advanced aortoiliac atherosclerotic disease. Evaluation of the aorta and IVC is limited in the absence of intravenous contrast. No portal venous gas identified. There is no adenopathy. Reproductive: The prostate and seminal vesicles are grossly unremarkable. No pelvic mass. Other: Mild diffuse mesenteric stranding. There is a small  calcified fat containing umbilical  hernia, likely sequela of chronic inflammation or incarceration. No fluid collection. Musculoskeletal: There is osteopenia with degenerative changes of the spine. Grade 1 L4-L5 retrolisthesis. A 9 mm sclerotic focus in the L4 vertebra is indeterminate but may represent a bone island. There is mild L2 compression deformity, age indeterminate, likely chronic. No definite acute fracture. No retropulsed fragment. IMPRESSION: 1. Extensive consolidative changes of the lungs concerning for pneumonia. Clinical correlation and follow-up to resolution recommended. There is a background of emphysema and interstitial lung disease. 2. Mild cardiomegaly. 3. No acute intra-abdominal or pelvic pathology. No bowel obstruction or active inflammation. 4. Fatty liver. 5. Mildly thickened and trabeculated appearance of the bladder wall, likely related to chronic bladder outlet obstruction. Correlation with urinalysis recommended to exclude cystitis. 6. Aortic Atherosclerosis (ICD10-I70.0) and Emphysema (ICD10-J43.9). Electronically Signed   By: Elgie CollardArash  Radparvar M.D.   On: 08/18/2016 21:23   Ct Chest Wo Contrast  Result Date: 08/17/2016 CLINICAL DATA:  83 year old male with rectal bleed and sepsis and abdominal pain. EXAM: CT CHEST, ABDOMEN AND PELVIS WITHOUT CONTRAST TECHNIQUE: Multidetector CT imaging of the chest, abdomen and pelvis was performed following the standard protocol without IV contrast. COMPARISON:  Chest radiograph dated 08/30/2016 and CT dated 11/06/2014 FINDINGS: Evaluation of this exam is limited in the absence of intravenous contrast. Evaluation is also limited due to respiratory motion artifact. CT CHEST FINDINGS Cardiovascular: There is mild cardiomegaly. No pericardial effusion. Mild coronary vascular calcification involving the left circumflex artery. There is mild atherosclerotic calcification of the thoracic aorta. No aneurysmal dilatation. Mild prominence of the central  pulmonary arteries suggestive of underlying pulmonary hypertension. Evaluation of the vasculature is limited in the absence of intravenous contrast. Mediastinum/Nodes: There is a mildly enlarged lymph node in the prevascular space measuring 15 mm in short axis. Aortopulmonic window lymph node measures 16 mm in short axis. Confluent subcarinal lymph node or adenopathy noted. Esophagus is grossly unremarkable as visualized. There is a 2 cm hypodense nodule in the right thyroid gland. Lungs/Pleura: There is severe centrilobular and paraseptal emphysema as well as areas of peribronchovascular and subpleural reticulation associated bronchiectasis consistent with interstitial lung disease. There are large patchy areas of airspace consolidation involving upper and lower lobes bilaterally, new compared to the prior CT of 2016 and concerning for superimposed infection. Correlation with clinical exam and follow-up after treatment recommended. There is probable small right pleural effusion versus pleural thickening. No pneumothorax. The central airways are patent. Musculoskeletal: There is degenerative changes of the spine. No acute osseous pathology. CT ABDOMEN PELVIS FINDINGS There is no intra-abdominal free air or free fluid. Hepatobiliary: There is diffuse fatty infiltration of the liver. No intrahepatic biliary ductal dilatation. Small layering sludge may be present within the gallbladder. No pericholecystic fluid. Pancreas: Unremarkable. No pancreatic ductal dilatation or surrounding inflammatory changes. Spleen: Normal in size without focal abnormality. Adrenals/Urinary Tract: The adrenal glands, kidneys, and the visualized ureters appear unremarkable. There is mild diffuse thickened and trabecular appearance of the bladder wall, likely related to chronic bladder outlet obstruction or chronic infection. Correlation with urinalysis recommended to exclude acute cystitis. Stomach/Bowel: There is no evidence of bowel  obstruction or active inflammation. The appendix is not visualized with certainty. No inflammatory changes identified in the right lower quadrant. Vascular/Lymphatic: Advanced aortoiliac atherosclerotic disease. Evaluation of the aorta and IVC is limited in the absence of intravenous contrast. No portal venous gas identified. There is no adenopathy. Reproductive: The prostate and seminal vesicles are grossly unremarkable. No pelvic mass. Other: Mild  diffuse mesenteric stranding. There is a small calcified fat containing umbilical hernia, likely sequela of chronic inflammation or incarceration. No fluid collection. Musculoskeletal: There is osteopenia with degenerative changes of the spine. Grade 1 L4-L5 retrolisthesis. A 9 mm sclerotic focus in the L4 vertebra is indeterminate but may represent a bone island. There is mild L2 compression deformity, age indeterminate, likely chronic. No definite acute fracture. No retropulsed fragment. IMPRESSION: 1. Extensive consolidative changes of the lungs concerning for pneumonia. Clinical correlation and follow-up to resolution recommended. There is a background of emphysema and interstitial lung disease. 2. Mild cardiomegaly. 3. No acute intra-abdominal or pelvic pathology. No bowel obstruction or active inflammation. 4. Fatty liver. 5. Mildly thickened and trabeculated appearance of the bladder wall, likely related to chronic bladder outlet obstruction. Correlation with urinalysis recommended to exclude cystitis. 6. Aortic Atherosclerosis (ICD10-I70.0) and Emphysema (ICD10-J43.9). Electronically Signed   By: Elgie Collard M.D.   On: 08/25/2016 21:23   Dg Chest Port 1 View  Result Date: 08/21/2016 CLINICAL DATA:  Rectal bleeding today. EXAM: PORTABLE CHEST 1 VIEW COMPARISON:  09/26/2014 FINDINGS: There is extensive airspace disease throughout both mid and lower lung zones. Cardiomegaly. No pneumothorax. No pleural effusion IMPRESSION: Extensive bilateral airspace  disease. Followup PA and lateral chest X-ray is recommended in 3-4 weeks following trial of antibiotic therapy to ensure resolution and exclude underlying malignancy. Electronically Signed   By: Jolaine Click M.D.   On: 09/04/2016 20:29    ASSESSMENT / PLAN:  Active Issues:  Acute hypoxic respiratory Failure Sepsis- Etiology Multifocal Pneumonia suspected Staphylococcus vs polymicrobial Multisystem Organ Failure Lactic Acidosis- Anion Gap Metabolic Acidosis  I spoke with Family at bedside and via telephone using [employer] [facility] interpreter This consisted of granddaughter, grandson, and daughter Explained the severity of patient's illness and poor prognosis Explained multisystem organ failure Explained the risks/benefits of aggressive intervention including CPR, Vasopressors, Intubation and Mechanical Ventilation. Discussed the strong possibility that if patient is intubated he may never be liberated from Mechanical Ventilation. Pt to their knowledge does not have a POLST. They expressed understanding of medical condition and circumstances and have decided to make patient DNR/DNI and place in aplliative/comfort care. This information was shared with Hospitalist and Pt will be taken off NIPPV  Started on Morphine ggt for comfort and all other comfort measures will be intiated. Ok to transfer to floor, does not require ICU.   Newell Coral MD Pulmonary and Critical Care Medicine [facility] Pager: [phone]  09/29/16, 3:34 AM

## 2016-10-10 NOTE — Progress Notes (Signed)
Pt not on BIPAP at this time.  

## 2016-10-10 NOTE — Care Management Note (Addendum)
Case Management Note  Patient Details  Name: Lee Irwin MRN: [id number] Date of Birth: 09/10/1923  Subjective/Objective:                  Dyspnea/pna/requiring o2 and Bipap  Action/Plan: Date:  September 09, 2016 Chart reviewed for concurrent status and case management needs. Will continue to follow patient progress. Discharge Planning: following for needs/has an RN at home through [facility]. Wheelchair bound at home due to rt leg paralysis. Expected discharge date: [id number] Lee [place], BSN, [place]   [phone]  Expected Discharge Date:   (unknown)               Expected Discharge Plan:  Home w Home Health Services  In-House Referral:     Discharge planning Services  CM Consult  Post Acute Care Choice:    Choice offered to:     DME Arranged:    DME Agency:     HH Arranged:    HH Agency:     Status of Service:  In process, will continue to follow  If discussed at Long Length of Stay Meetings, dates discussed:    Additional Comments:  Golda AcreDavis, Tvisha Schwoerer Lynn, RN 09/30/2016, 8:13 AM

## 2016-10-10 NOTE — Death Summary Note (Signed)
DEATH SUMMARY   Patient Details  Name: Lee Irwin MRN: [id number] DOB: 06-24-1923  Admission/Discharge Information   Admit Date:  October 08, 2016  Date of Death: Date of Death: 10-09-2016  Time of Death: Time of Death: 0637  Length of Stay: 1  Referring Physician: Tobey Grim, MD   Reason(s) for Hospitalization  Sepsis due to pneumonia.  Diagnoses  Preliminary cause of death:  Secondary Diagnoses (including complications and co-morbidities):  Principal Problem:   Sepsis due to pneumonia [facility]) Active Problems:   GERD (gastroesophageal reflux disease)   History of CVA (cerebrovascular accident)   AKI (acute kidney injury) (HCC)   Hypokalemia   Abnormal LFTs   Anemia   Brief Hospital Course (including significant findings, care, treatment, and services provided and events leading to death)   Lee Irwin is a 83 y.o. male from [place] with medical history significant of left eye anisocoria, osteoarthritis, cataracts, COPD, wheelchair-bound who is brought in by his family due to abdominal pain, hematochezia in the morning today, weakness, nausea, multiple episodes of emesis and multiple episodes of diarrhea for the past 2 days. Not sure if the patient has had fever, chills, but has been fatigued with decreased appetite. He denies through translator, headache, chest pain, abdominal pain, back pain at this time. He complains of shortness of breath despite having 100% Ventimask oxygen. CCM was initially consulted, but recommended stepdown admission since his lactic acid was improving substantially.     ED Course: The patient was given normal saline for a total of 2000 mL, vancomycin and Zosyn per pharmacy. Subsequently, I had to order BiPAP ventilation due to hypoxia in the low 80s despite Ventimask at 100%. Despite BiPAP ventilation his oxygen saturation only rose to low 90s.  He was also in and out of atrial fibrillation while in the emergency department. He  subsequently had a transient episode of SVT at 180 BPM, which resolved with carotid massage. His atrial fibrillation broke with Cardizem 5 mg IVP. Critical care medicine was subsequently consulted.  After CCM evaluated the case and discussed the patient's prognosis with the family, the family decided to make the patient DNR/DNI. BiPAP ventilation, blood draws, IV antibiotics and any other non-comfort measures were discontinued. Morphine infusion and non-rebreather oxygen for comfort were started by CCM. The patient past peacefully later in the morning. He was pronounced at 0637.   Pertinent Labs and Studies  Significant Diagnostic Studies Ct Abdomen Pelvis Wo Contrast  Result Date: Oct 08, 2016 CLINICAL DATA:  83 year old male with rectal bleed and sepsis and abdominal pain. EXAM: CT CHEST, ABDOMEN AND PELVIS WITHOUT CONTRAST TECHNIQUE: Multidetector CT imaging of the chest, abdomen and pelvis was performed following the standard protocol without IV contrast. COMPARISON:  Chest radiograph dated 10-08-2016 and CT dated 11/06/2014 FINDINGS: Evaluation of this exam is limited in the absence of intravenous contrast. Evaluation is also limited due to respiratory motion artifact. CT CHEST FINDINGS Cardiovascular: There is mild cardiomegaly. No pericardial effusion. Mild coronary vascular calcification involving the left circumflex artery. There is mild atherosclerotic calcification of the thoracic aorta. No aneurysmal dilatation. Mild prominence of the central pulmonary arteries suggestive of underlying pulmonary hypertension. Evaluation of the vasculature is limited in the absence of intravenous contrast. Mediastinum/Nodes: There is a mildly enlarged lymph node in the prevascular space measuring 15 mm in short axis. Aortopulmonic window lymph node measures 16 mm in short axis. Confluent subcarinal lymph node or adenopathy noted. Esophagus is grossly unremarkable as visualized. There is a 2 cm hypodense  nodule in  the right thyroid gland. Lungs/Pleura: There is severe centrilobular and paraseptal emphysema as well as areas of peribronchovascular and subpleural reticulation associated bronchiectasis consistent with interstitial lung disease. There are large patchy areas of airspace consolidation involving upper and lower lobes bilaterally, new compared to the prior CT of 2016 and concerning for superimposed infection. Correlation with clinical exam and follow-up after treatment recommended. There is probable small right pleural effusion versus pleural thickening. No pneumothorax. The central airways are patent. Musculoskeletal: There is degenerative changes of the spine. No acute osseous pathology. CT ABDOMEN PELVIS FINDINGS There is no intra-abdominal free air or free fluid. Hepatobiliary: There is diffuse fatty infiltration of the liver. No intrahepatic biliary ductal dilatation. Small layering sludge may be present within the gallbladder. No pericholecystic fluid. Pancreas: Unremarkable. No pancreatic ductal dilatation or surrounding inflammatory changes. Spleen: Normal in size without focal abnormality. Adrenals/Urinary Tract: The adrenal glands, kidneys, and the visualized ureters appear unremarkable. There is mild diffuse thickened and trabecular appearance of the bladder wall, likely related to chronic bladder outlet obstruction or chronic infection. Correlation with urinalysis recommended to exclude acute cystitis. Stomach/Bowel: There is no evidence of bowel obstruction or active inflammation. The appendix is not visualized with certainty. No inflammatory changes identified in the right lower quadrant. Vascular/Lymphatic: Advanced aortoiliac atherosclerotic disease. Evaluation of the aorta and IVC is limited in the absence of intravenous contrast. No portal venous gas identified. There is no adenopathy. Reproductive: The prostate and seminal vesicles are grossly unremarkable. No pelvic mass. Other: Mild diffuse  mesenteric stranding. There is a small calcified fat containing umbilical hernia, likely sequela of chronic inflammation or incarceration. No fluid collection. Musculoskeletal: There is osteopenia with degenerative changes of the spine. Grade 1 L4-L5 retrolisthesis. A 9 mm sclerotic focus in the L4 vertebra is indeterminate but may represent a bone island. There is mild L2 compression deformity, age indeterminate, likely chronic. No definite acute fracture. No retropulsed fragment. IMPRESSION: 1. Extensive consolidative changes of the lungs concerning for pneumonia. Clinical correlation and follow-up to resolution recommended. There is a background of emphysema and interstitial lung disease. 2. Mild cardiomegaly. 3. No acute intra-abdominal or pelvic pathology. No bowel obstruction or active inflammation. 4. Fatty liver. 5. Mildly thickened and trabeculated appearance of the bladder wall, likely related to chronic bladder outlet obstruction. Correlation with urinalysis recommended to exclude cystitis. 6. Aortic Atherosclerosis (ICD10-I70.0) and Emphysema (ICD10-J43.9). Electronically Signed   By: Elgie Collard M.D.   On: 2016/10/05 21:23   Ct Chest Wo Contrast  Result Date: 10/05/16 CLINICAL DATA:  83 year old male with rectal bleed and sepsis and abdominal pain. EXAM: CT CHEST, ABDOMEN AND PELVIS WITHOUT CONTRAST TECHNIQUE: Multidetector CT imaging of the chest, abdomen and pelvis was performed following the standard protocol without IV contrast. COMPARISON:  Chest radiograph dated 10/05/2016 and CT dated 11/06/2014 FINDINGS: Evaluation of this exam is limited in the absence of intravenous contrast. Evaluation is also limited due to respiratory motion artifact. CT CHEST FINDINGS Cardiovascular: There is mild cardiomegaly. No pericardial effusion. Mild coronary vascular calcification involving the left circumflex artery. There is mild atherosclerotic calcification of the thoracic aorta. No aneurysmal  dilatation. Mild prominence of the central pulmonary arteries suggestive of underlying pulmonary hypertension. Evaluation of the vasculature is limited in the absence of intravenous contrast. Mediastinum/Nodes: There is a mildly enlarged lymph node in the prevascular space measuring 15 mm in short axis. Aortopulmonic window lymph node measures 16 mm in short axis. Confluent subcarinal lymph node or adenopathy noted.  Esophagus is grossly unremarkable as visualized. There is a 2 cm hypodense nodule in the right thyroid gland. Lungs/Pleura: There is severe centrilobular and paraseptal emphysema as well as areas of peribronchovascular and subpleural reticulation associated bronchiectasis consistent with interstitial lung disease. There are large patchy areas of airspace consolidation involving upper and lower lobes bilaterally, new compared to the prior CT of 2016 and concerning for superimposed infection. Correlation with clinical exam and follow-up after treatment recommended. There is probable small right pleural effusion versus pleural thickening. No pneumothorax. The central airways are patent. Musculoskeletal: There is degenerative changes of the spine. No acute osseous pathology. CT ABDOMEN PELVIS FINDINGS There is no intra-abdominal free air or free fluid. Hepatobiliary: There is diffuse fatty infiltration of the liver. No intrahepatic biliary ductal dilatation. Small layering sludge may be present within the gallbladder. No pericholecystic fluid. Pancreas: Unremarkable. No pancreatic ductal dilatation or surrounding inflammatory changes. Spleen: Normal in size without focal abnormality. Adrenals/Urinary Tract: The adrenal glands, kidneys, and the visualized ureters appear unremarkable. There is mild diffuse thickened and trabecular appearance of the bladder wall, likely related to chronic bladder outlet obstruction or chronic infection. Correlation with urinalysis recommended to exclude acute cystitis.  Stomach/Bowel: There is no evidence of bowel obstruction or active inflammation. The appendix is not visualized with certainty. No inflammatory changes identified in the right lower quadrant. Vascular/Lymphatic: Advanced aortoiliac atherosclerotic disease. Evaluation of the aorta and IVC is limited in the absence of intravenous contrast. No portal venous gas identified. There is no adenopathy. Reproductive: The prostate and seminal vesicles are grossly unremarkable. No pelvic mass. Other: Mild diffuse mesenteric stranding. There is a small calcified fat containing umbilical hernia, likely sequela of chronic inflammation or incarceration. No fluid collection. Musculoskeletal: There is osteopenia with degenerative changes of the spine. Grade 1 L4-L5 retrolisthesis. A 9 mm sclerotic focus in the L4 vertebra is indeterminate but may represent a bone island. There is mild L2 compression deformity, age indeterminate, likely chronic. No definite acute fracture. No retropulsed fragment. IMPRESSION: 1. Extensive consolidative changes of the lungs concerning for pneumonia. Clinical correlation and follow-up to resolution recommended. There is a background of emphysema and interstitial lung disease. 2. Mild cardiomegaly. 3. No acute intra-abdominal or pelvic pathology. No bowel obstruction or active inflammation. 4. Fatty liver. 5. Mildly thickened and trabeculated appearance of the bladder wall, likely related to chronic bladder outlet obstruction. Correlation with urinalysis recommended to exclude cystitis. 6. Aortic Atherosclerosis (ICD10-I70.0) and Emphysema (ICD10-J43.9). Electronically Signed   By: Elgie CollardArash  Radparvar M.D.   On: 09/03/2016 21:23   Dg Chest Port 1 View  Result Date: 08/21/2016 CLINICAL DATA:  Rectal bleeding today. EXAM: PORTABLE CHEST 1 VIEW COMPARISON:  09/26/2014 FINDINGS: There is extensive airspace disease throughout both mid and lower lung zones. Cardiomegaly. No pneumothorax. No pleural effusion  IMPRESSION: Extensive bilateral airspace disease. Followup PA and lateral chest X-ray is recommended in 3-4 weeks following trial of antibiotic therapy to ensure resolution and exclude underlying malignancy. Electronically Signed   By: Jolaine ClickArthur  Hoss M.D.   On: 09/07/2016 20:29    Microbiology Recent Results (from the past 240 hour(s))  Blood Culture (routine x 2)     Status: None (Preliminary result)   Collection Time: 09/03/2016  6:28 PM  Result Value Ref Range Status   Specimen Description BLOOD BLOOD RIGHT FOREARM  Final   Special Requests IN PEDIATRIC BOTTLE Blood Culture adequate volume  Final   Culture   Final    NO GROWTH < 24 HOURS  Performed at [facility], [address] [place], [place] [address]    Report Status PENDING  Incomplete  Blood Culture (routine x 2)     Status: None (Preliminary result)   Collection Time: 09/02/2016  6:32 PM  Result Value Ref Range Status   Specimen Description BLOOD LEFT HAND  Final   Special Requests IN PEDIATRIC BOTTLE Blood Culture adequate volume  Final   Culture   Final    NO GROWTH < 24 HOURS Performed at [facility], [address] [place], [place] [address]    Report Status PENDING  Incomplete  MRSA PCR Screening     Status: None   Collection Time: 11/01/2016  5:00 AM  Result Value Ref Range Status   MRSA by PCR NEGATIVE NEGATIVE Final    Comment:        The GeneXpert MRSA Assay (FDA approved for NASAL specimens only), is one component of a comprehensive MRSA colonization surveillance program. It is not intended to diagnose MRSA infection nor to guide or monitor treatment for MRSA infections.     Lab Basic Metabolic Panel:  Recent Labs Lab 09/04/2016 1810 11/01/2016 0122 11/01/2016 0529  NA 135  --  138  K 2.9*  --  3.2*  CL 99*  --  108  CO2 12*  --  18*  GLUCOSE 84  --  34*  BUN 35*  --  33*  CREATININE 2.05*  --  1.87*  CALCIUM 7.8*  --  7.2*  MG  --  1.8 2.9*  PHOS  --  4.2  --    Liver Function  Tests:  Recent Labs Lab 08/10/2016 1810 11/01/2016 0529  AST 447* 329*  ALT 126* 113*  ALKPHOS 97 205*  BILITOT 2.5* 2.4*  PROT 7.1 6.4*  ALBUMIN 2.4* 2.1*   No results for input(s): LIPASE, AMYLASE in the last 168 hours. No results for input(s): AMMONIA in the last 168 hours. CBC:  Recent Labs Lab 09/04/16 1232 08/24/2016 1810 11/01/2016 0529  WBC 12.2* 30.8* 37.3*  NEUTROABS 8.7*  --  36.2*  HGB 11.3* 12.4* 13.9  HCT 35.8* 38.1* 43.6  MCV 90 91.1 92.8  PLT 214 129* 119*   Cardiac Enzymes: No results for input(s): CKTOTAL, CKMB, CKMBINDEX, TROPONINI in the last 168 hours. Sepsis Labs:  Recent Labs Lab 09/04/16 1232 09/03/2016 1810 08/10/2016 1836 11/01/2016 0122 11/01/2016 0529  WBC 12.2* 30.8*  --   --  37.3*  LATICACIDVEN  --   --  10.68* 4.8* 6.1*    Procedures/Operations  The patient was ventilated with BiPAP without significant improvement.   Bobette Moavid Manuel Ortiz 10/09/2016, 7:30 PM

## 2016-10-10 NOTE — ED Notes (Signed)
An attempt to explain code status and it meaning using interpreter service [WALI]  was unsuccessful and pt grand daughter and grand son were unable to communicate an understanding of DNR or code status.

## 2016-10-10 NOTE — ED Notes (Signed)
Pt medicated for Hr 160's to 170's cardzeim per order Dr Robb Matarrtiz who was at pt bedside.

## 2016-10-10 DEATH — deceased

## 2017-03-21 IMAGING — US US ABDOMEN COMPLETE
1 series · 13 of 25 positions shown · non-contrast
Comparison: None.

CLINICAL DATA: Gastroesophageal reflux. Esophagitis presence not
specified. Loss of weight.

EXAM:
ULTRASOUND ABDOMEN COMPLETE

[Series 1: us abdomen complete · 0.15mm/px · 13 of 78 slices shown]
[im 1/78]
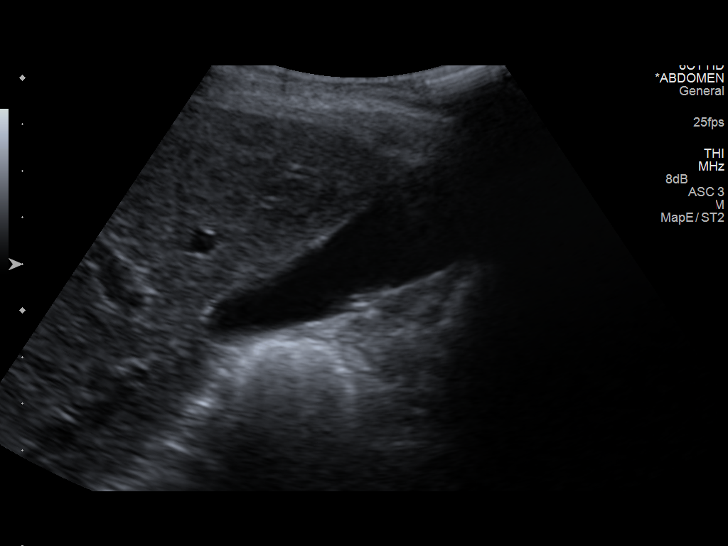
[im 7/78]
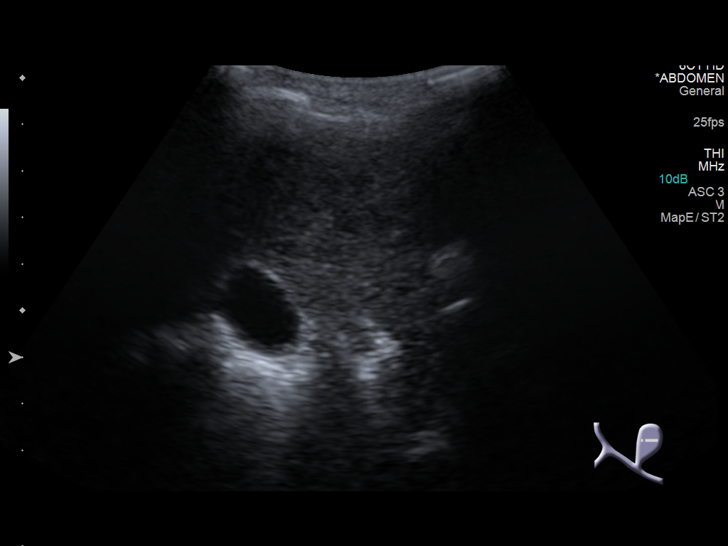
[im 13/78]
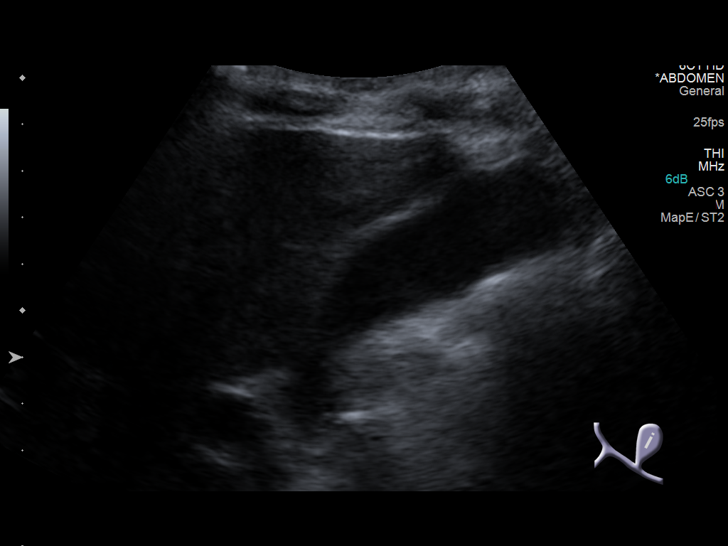
[im 20/78]
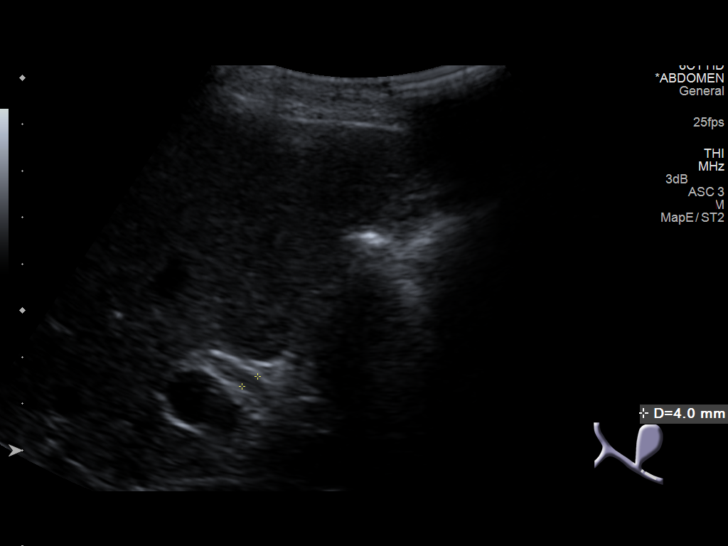
[im 26/78]
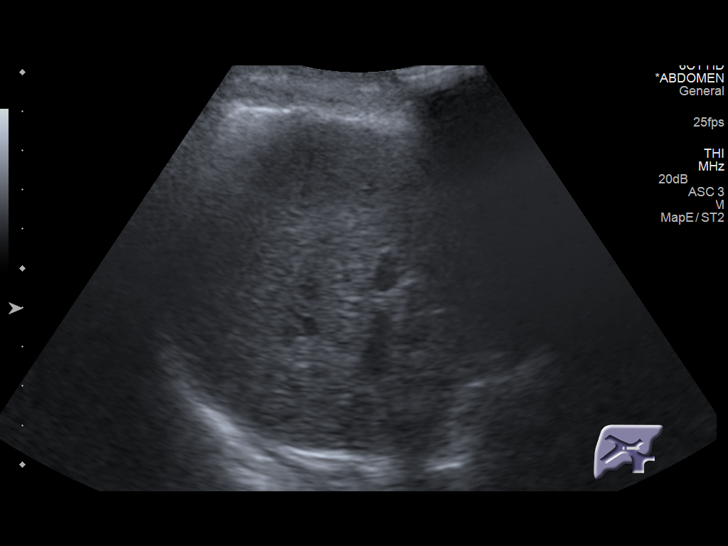
[im 33/78]
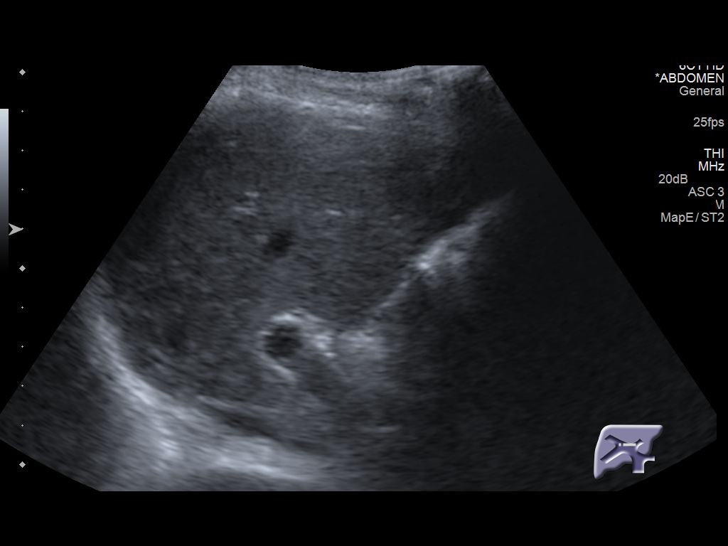
[im 39/78]
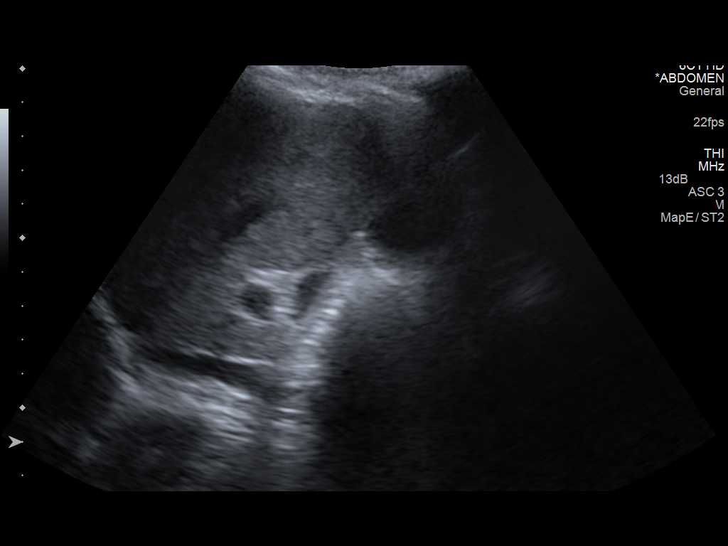
[im 45/78]
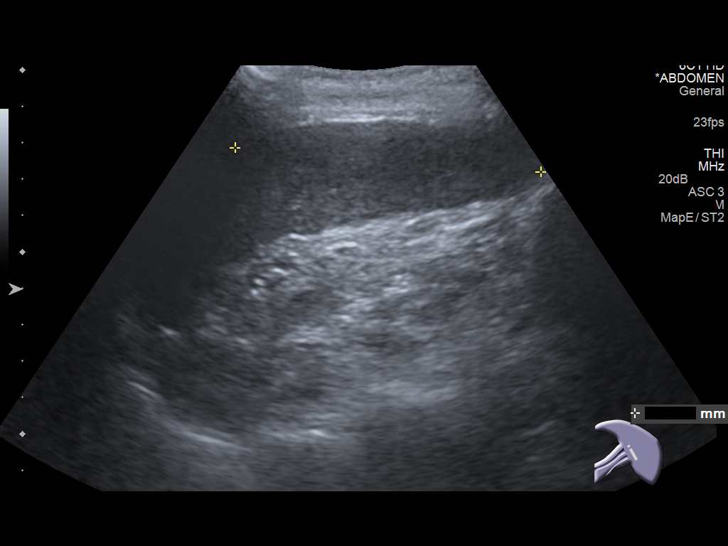
[im 52/78]
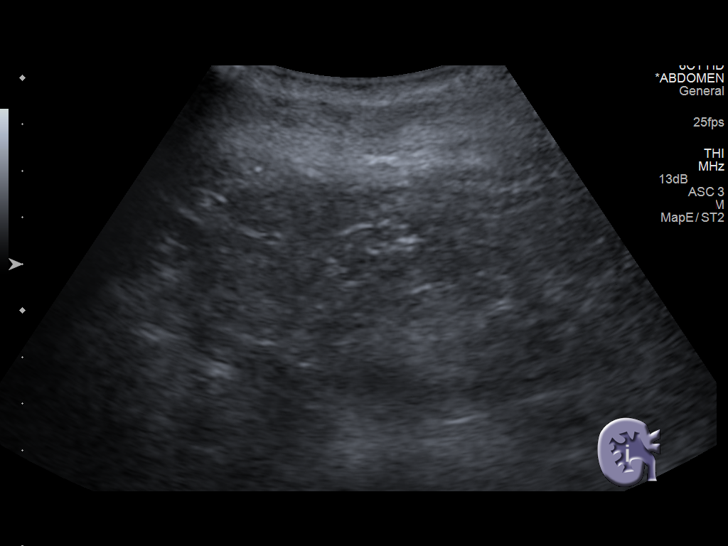
[im 58/78]
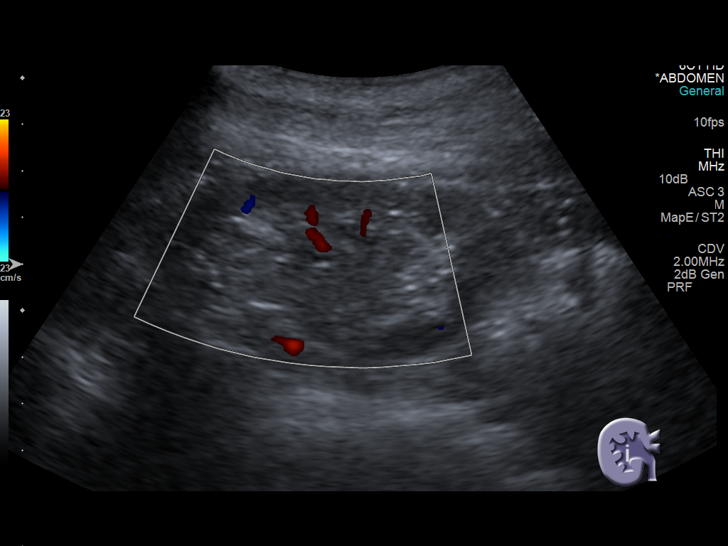
[im 65/78]
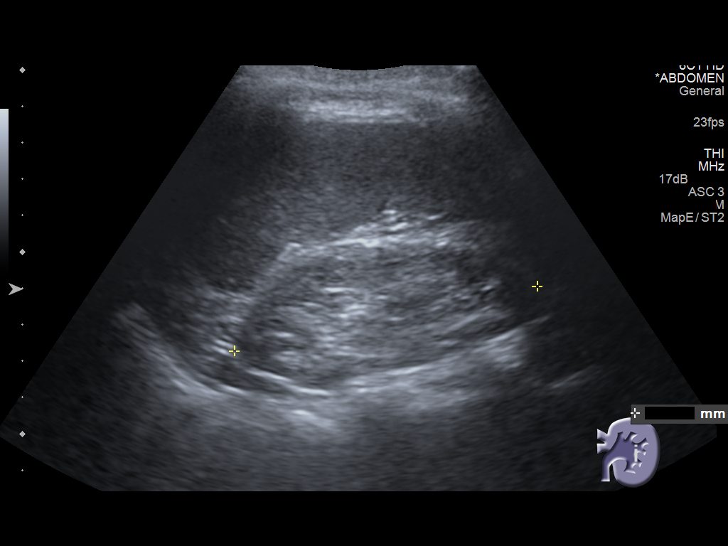
[im 71/78]
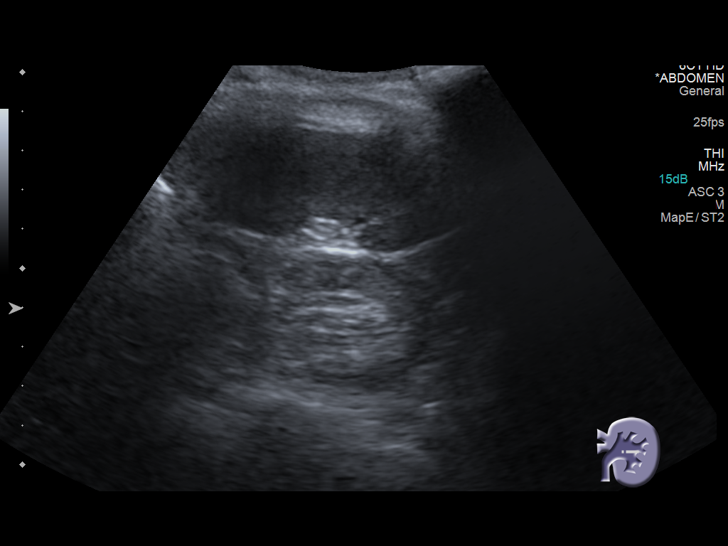
[im 78/78]
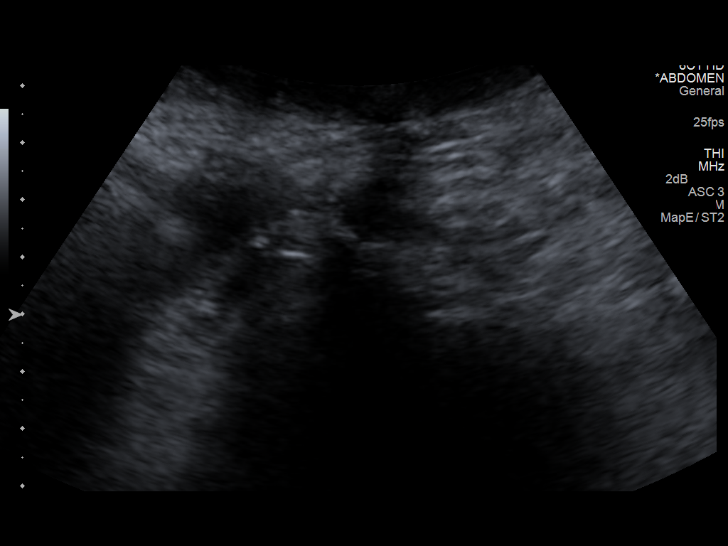

[13 of 25 positions shown; findings below may reference images not displayed]

FINDINGS: Gallbladder: No gallstones or wall thickening visualized. No
sonographic Murphy sign noted. Wall thickness is 1.6 mm, within
normal limits

Common bile duct: Diameter: 4.0 mm, within normal limits

Liver: No focal lesion identified. Within normal limits in
parenchymal echogenicity.

IVC: No abnormality visualized.

Pancreas: Visualized portion unremarkable.

Spleen: Size and appearance within normal limits.

Right Kidney: Length: 8.3 cm, somewhat atrophic. There is thinning
of the cortex which is somewhat echogenic. No focal mass lesion or
hydronephrosis is present.

Left Kidney: Length: 8.5 cm, somewhat atrophic. There is thinning of
the cortex which is somewhat echogenic. No focal mass lesion or
hydronephrosis is present

Abdominal aorta: No aneurysm visualized.

Other findings: None.
IMPRESSION: 1. No acute or focal abnormality to explain epigastric pain or
weight loss.
2. Kidneys are atrophic bilaterally with increased echogenicity
suggesting medical renal disease. Please correlate with renal
function tests.
